# Patient Record
Sex: Male | Born: 1960
Health system: Southern US, Community
[De-identification: ages and names within clinical notes are randomized; demographics above are authoritative.]

## PROBLEM LIST (undated history)

## (undated) DIAGNOSIS — G4730 Sleep apnea, unspecified: Secondary | ICD-10-CM

## (undated) DIAGNOSIS — I10 Essential (primary) hypertension: Secondary | ICD-10-CM

## (undated) DIAGNOSIS — K219 Gastro-esophageal reflux disease without esophagitis: Secondary | ICD-10-CM

## (undated) DIAGNOSIS — E785 Hyperlipidemia, unspecified: Secondary | ICD-10-CM

## (undated) DIAGNOSIS — F419 Anxiety disorder, unspecified: Secondary | ICD-10-CM

## (undated) DIAGNOSIS — J189 Pneumonia, unspecified organism: Secondary | ICD-10-CM

## (undated) DIAGNOSIS — M519 Unspecified thoracic, thoracolumbar and lumbosacral intervertebral disc disorder: Secondary | ICD-10-CM

## (undated) DIAGNOSIS — R7303 Prediabetes: Secondary | ICD-10-CM

## (undated) DIAGNOSIS — K529 Noninfective gastroenteritis and colitis, unspecified: Secondary | ICD-10-CM

## (undated) DIAGNOSIS — K635 Polyp of colon: Secondary | ICD-10-CM

## (undated) DIAGNOSIS — M4310 Spondylolisthesis, site unspecified: Secondary | ICD-10-CM

## (undated) HISTORY — PX: COLONOSCOPY: SHX174

## (undated) HISTORY — DX: Sleep apnea, unspecified: G47.30

## (undated) HISTORY — DX: Noninfective gastroenteritis and colitis, unspecified: K52.9

## (undated) HISTORY — DX: Gastro-esophageal reflux disease without esophagitis: K21.9

## (undated) HISTORY — DX: Essential (primary) hypertension: I10

## (undated) HISTORY — PX: SPINAL FUSION: SHX223

## (undated) HISTORY — DX: Pneumonia, unspecified organism: J18.9

## (undated) HISTORY — DX: Anxiety disorder, unspecified: F41.9

## (undated) HISTORY — DX: Prediabetes: R73.03

## (undated) HISTORY — DX: Polyp of colon: K63.5

## (undated) HISTORY — DX: Unspecified thoracic, thoracolumbar and lumbosacral intervertebral disc disorder: M51.9

## (undated) HISTORY — DX: Hyperlipidemia, unspecified: E78.5

## (undated) HISTORY — DX: Spondylolisthesis, site unspecified: M43.10

---

## 1993-01-25 DIAGNOSIS — J189 Pneumonia, unspecified organism: Secondary | ICD-10-CM

## 1993-01-25 HISTORY — DX: Pneumonia, unspecified organism: J18.9

## 1995-01-26 HISTORY — PX: LAMINECTOMY: SHX219

## 1996-12-10 ENCOUNTER — Encounter: Payer: Self-pay | Admitting: Internal Medicine

## 1997-07-04 ENCOUNTER — Emergency Department (HOSPITAL_COMMUNITY): Admission: EM | Admit: 1997-07-04 | Discharge: 1997-07-04 | Payer: Self-pay | Admitting: Endocrinology

## 1997-11-17 ENCOUNTER — Encounter: Payer: Self-pay | Admitting: Neurosurgery

## 1997-11-17 ENCOUNTER — Ambulatory Visit (HOSPITAL_COMMUNITY): Admission: RE | Admit: 1997-11-17 | Discharge: 1997-11-17 | Payer: Self-pay | Admitting: Neurosurgery

## 1997-12-04 ENCOUNTER — Encounter: Payer: Self-pay | Admitting: Neurosurgery

## 1997-12-04 ENCOUNTER — Ambulatory Visit (HOSPITAL_COMMUNITY): Admission: RE | Admit: 1997-12-04 | Discharge: 1997-12-04 | Payer: Self-pay | Admitting: Neurosurgery

## 1997-12-20 ENCOUNTER — Encounter: Payer: Self-pay | Admitting: Neurosurgery

## 1998-01-03 ENCOUNTER — Ambulatory Visit (HOSPITAL_COMMUNITY): Admission: RE | Admit: 1998-01-03 | Discharge: 1998-01-03 | Payer: Self-pay | Admitting: Neurosurgery

## 1998-01-03 ENCOUNTER — Encounter: Payer: Self-pay | Admitting: Neurosurgery

## 2000-06-25 DIAGNOSIS — K529 Noninfective gastroenteritis and colitis, unspecified: Secondary | ICD-10-CM

## 2000-06-25 HISTORY — DX: Noninfective gastroenteritis and colitis, unspecified: K52.9

## 2003-10-04 ENCOUNTER — Encounter: Payer: Self-pay | Admitting: Internal Medicine

## 2004-01-17 ENCOUNTER — Ambulatory Visit: Payer: Self-pay | Admitting: Family Medicine

## 2004-01-19 ENCOUNTER — Emergency Department: Payer: Self-pay | Admitting: Unknown Physician Specialty

## 2004-01-19 ENCOUNTER — Other Ambulatory Visit: Payer: Self-pay

## 2004-12-02 ENCOUNTER — Ambulatory Visit: Payer: Self-pay | Admitting: Internal Medicine

## 2005-01-25 HISTORY — PX: LUMBAR SPINE SURGERY: SHX701

## 2005-04-02 ENCOUNTER — Ambulatory Visit: Payer: Self-pay | Admitting: Internal Medicine

## 2005-07-27 ENCOUNTER — Ambulatory Visit: Payer: Self-pay | Admitting: Family Medicine

## 2005-07-29 ENCOUNTER — Encounter: Admission: RE | Admit: 2005-07-29 | Discharge: 2005-07-29 | Payer: Self-pay | Admitting: Family Medicine

## 2005-08-02 ENCOUNTER — Encounter: Admission: RE | Admit: 2005-08-02 | Discharge: 2005-08-02 | Payer: Self-pay | Admitting: Family Medicine

## 2005-08-02 ENCOUNTER — Encounter (INDEPENDENT_AMBULATORY_CARE_PROVIDER_SITE_OTHER): Payer: Self-pay | Admitting: Internal Medicine

## 2005-12-02 ENCOUNTER — Ambulatory Visit: Payer: Self-pay | Admitting: Internal Medicine

## 2006-02-11 ENCOUNTER — Ambulatory Visit: Payer: Self-pay | Admitting: Internal Medicine

## 2006-09-19 ENCOUNTER — Ambulatory Visit: Payer: Self-pay | Admitting: Family Medicine

## 2006-09-28 ENCOUNTER — Encounter: Payer: Self-pay | Admitting: Family Medicine

## 2006-09-28 ENCOUNTER — Ambulatory Visit: Payer: Self-pay | Admitting: *Deleted

## 2006-10-05 ENCOUNTER — Ambulatory Visit: Payer: Self-pay | Admitting: *Deleted

## 2006-10-05 DIAGNOSIS — K219 Gastro-esophageal reflux disease without esophagitis: Secondary | ICD-10-CM | POA: Insufficient documentation

## 2006-10-05 DIAGNOSIS — E785 Hyperlipidemia, unspecified: Secondary | ICD-10-CM | POA: Insufficient documentation

## 2006-10-05 DIAGNOSIS — M5137 Other intervertebral disc degeneration, lumbosacral region: Secondary | ICD-10-CM | POA: Insufficient documentation

## 2006-10-05 DIAGNOSIS — J4530 Mild persistent asthma, uncomplicated: Secondary | ICD-10-CM | POA: Insufficient documentation

## 2006-10-12 ENCOUNTER — Ambulatory Visit: Payer: Self-pay | Admitting: *Deleted

## 2006-10-19 ENCOUNTER — Ambulatory Visit: Payer: Self-pay | Admitting: Internal Medicine

## 2006-10-19 DIAGNOSIS — F411 Generalized anxiety disorder: Secondary | ICD-10-CM | POA: Insufficient documentation

## 2006-10-20 LAB — CONVERTED CEMR LAB
ALT: 22 units/L (ref 0–53)
AST: 20 units/L (ref 0–37)
Albumin: 4.1 g/dL (ref 3.5–5.2)
Alkaline Phosphatase: 44 units/L (ref 39–117)
BUN: 11 mg/dL (ref 6–23)
Basophils Absolute: 0 10*3/uL (ref 0.0–0.1)
Basophils Relative: 0.2 % (ref 0.0–1.0)
Bilirubin, Direct: 0.1 mg/dL (ref 0.0–0.3)
CO2: 30 meq/L (ref 19–32)
Calcium: 9.3 mg/dL (ref 8.4–10.5)
Chloride: 107 meq/L (ref 96–112)
Cholesterol: 224 mg/dL (ref 0–200)
Creatinine, Ser: 1 mg/dL (ref 0.4–1.5)
Direct LDL: 154 mg/dL
Eosinophils Absolute: 0.1 10*3/uL (ref 0.0–0.6)
Eosinophils Relative: 1.4 % (ref 0.0–5.0)
GFR calc Af Amer: 104 mL/min
GFR calc non Af Amer: 86 mL/min
Glucose, Bld: 93 mg/dL (ref 70–99)
HCT: 41.3 % (ref 39.0–52.0)
HDL: 36.8 mg/dL — ABNORMAL LOW (ref >39.0)
Hemoglobin: 14 g/dL (ref 13.0–17.0)
Lymphocytes Relative: 26.6 % (ref 12.0–46.0)
MCHC: 33.8 g/dL (ref 30.0–36.0)
MCV: 84.5 fL (ref 78.0–100.0)
Monocytes Absolute: 0.4 10*3/uL (ref 0.2–0.7)
Monocytes Relative: 7.3 % (ref 3.0–11.0)
Neutro Abs: 3.9 10*3/uL (ref 1.4–7.7)
Neutrophils Relative %: 64.5 % (ref 43.0–77.0)
Phosphorus: 3.8 mg/dL (ref 2.3–4.6)
Platelets: 228 10*3/uL (ref 150–400)
Potassium: 4.3 meq/L (ref 3.5–5.1)
RBC: 4.88 M/uL (ref 4.22–5.81)
RDW: 13.2 % (ref 11.5–14.6)
Sodium: 143 meq/L (ref 135–145)
TSH: 1.17 microintl units/mL (ref 0.35–5.50)
Total Bilirubin: 0.8 mg/dL (ref 0.3–1.2)
Total CHOL/HDL Ratio: 6.1
Total Protein: 7.1 g/dL (ref 6.0–8.3)
Triglycerides: 203 mg/dL (ref 0–149)
VLDL: 41 mg/dL — ABNORMAL HIGH (ref 0–40)
WBC: 6 10*3/uL (ref 4.5–10.5)

## 2006-11-02 ENCOUNTER — Ambulatory Visit: Payer: Self-pay | Admitting: *Deleted

## 2006-11-14 ENCOUNTER — Telehealth (INDEPENDENT_AMBULATORY_CARE_PROVIDER_SITE_OTHER): Payer: Self-pay | Admitting: *Deleted

## 2006-11-29 ENCOUNTER — Telehealth (INDEPENDENT_AMBULATORY_CARE_PROVIDER_SITE_OTHER): Payer: Self-pay | Admitting: *Deleted

## 2006-12-27 ENCOUNTER — Other Ambulatory Visit: Payer: Self-pay

## 2006-12-27 ENCOUNTER — Emergency Department: Payer: Self-pay | Admitting: Emergency Medicine

## 2006-12-28 ENCOUNTER — Ambulatory Visit: Payer: Self-pay | Admitting: Internal Medicine

## 2006-12-29 LAB — CONVERTED CEMR LAB
ALT: 24 units/L (ref 0–53)
AST: 21 units/L (ref 0–37)
Albumin: 4.3 g/dL (ref 3.5–5.2)
Alkaline Phosphatase: 40 units/L (ref 39–117)
BUN: 15 mg/dL (ref 6–23)
Basophils Absolute: 0 10*3/uL (ref 0.0–0.1)
Basophils Relative: 0.1 % (ref 0.0–1.0)
Bilirubin, Direct: 0.1 mg/dL (ref 0.0–0.3)
CO2: 29 meq/L (ref 19–32)
Calcium: 10.2 mg/dL (ref 8.4–10.5)
Chloride: 100 meq/L (ref 96–112)
Creatinine, Ser: 0.9 mg/dL (ref 0.4–1.5)
Eosinophils Absolute: 0.1 10*3/uL (ref 0.0–0.6)
Eosinophils Relative: 0.7 % (ref 0.0–5.0)
GFR calc Af Amer: 118 mL/min
GFR calc non Af Amer: 97 mL/min
Glucose, Bld: 96 mg/dL (ref 70–99)
HCT: 40.3 % (ref 39.0–52.0)
Hemoglobin: 14.2 g/dL (ref 13.0–17.0)
Lymphocytes Relative: 16.7 % (ref 12.0–46.0)
MCHC: 35.1 g/dL (ref 30.0–36.0)
MCV: 83.9 fL (ref 78.0–100.0)
Monocytes Absolute: 0.5 10*3/uL (ref 0.2–0.7)
Monocytes Relative: 6 % (ref 3.0–11.0)
Neutro Abs: 6.4 10*3/uL (ref 1.4–7.7)
Neutrophils Relative %: 76.5 % (ref 43.0–77.0)
Phosphorus: 4.1 mg/dL (ref 2.3–4.6)
Platelets: 229 10*3/uL (ref 150–400)
Potassium: 4.3 meq/L (ref 3.5–5.1)
RBC: 4.8 M/uL (ref 4.22–5.81)
RDW: 12.9 % (ref 11.5–14.6)
Sed Rate: 15 mm/hr (ref 0–20)
Sodium: 138 meq/L (ref 135–145)
TSH: 0.8 microintl units/mL (ref 0.35–5.50)
Total Bilirubin: 0.9 mg/dL (ref 0.3–1.2)
Total Protein: 7.3 g/dL (ref 6.0–8.3)
WBC: 8.4 10*3/uL (ref 4.5–10.5)

## 2007-01-11 ENCOUNTER — Encounter: Payer: Self-pay | Admitting: Internal Medicine

## 2007-01-13 ENCOUNTER — Telehealth: Payer: Self-pay | Admitting: Family Medicine

## 2007-01-13 ENCOUNTER — Ambulatory Visit: Payer: Self-pay | Admitting: Internal Medicine

## 2007-01-16 ENCOUNTER — Encounter: Payer: Self-pay | Admitting: Internal Medicine

## 2007-01-16 ENCOUNTER — Telehealth (INDEPENDENT_AMBULATORY_CARE_PROVIDER_SITE_OTHER): Payer: Self-pay | Admitting: *Deleted

## 2007-01-16 ENCOUNTER — Ambulatory Visit: Payer: Self-pay | Admitting: Internal Medicine

## 2007-01-17 ENCOUNTER — Telehealth (INDEPENDENT_AMBULATORY_CARE_PROVIDER_SITE_OTHER): Payer: Self-pay | Admitting: *Deleted

## 2007-02-17 ENCOUNTER — Telehealth (INDEPENDENT_AMBULATORY_CARE_PROVIDER_SITE_OTHER): Payer: Self-pay | Admitting: *Deleted

## 2007-02-22 ENCOUNTER — Encounter: Payer: Self-pay | Admitting: Internal Medicine

## 2007-03-06 ENCOUNTER — Ambulatory Visit: Payer: Self-pay | Admitting: Unknown Physician Specialty

## 2007-05-22 ENCOUNTER — Telehealth (INDEPENDENT_AMBULATORY_CARE_PROVIDER_SITE_OTHER): Payer: Self-pay | Admitting: *Deleted

## 2007-11-06 ENCOUNTER — Telehealth: Payer: Self-pay | Admitting: Family Medicine

## 2007-11-08 ENCOUNTER — Telehealth: Payer: Self-pay | Admitting: Internal Medicine

## 2007-11-15 ENCOUNTER — Telehealth: Payer: Self-pay | Admitting: Internal Medicine

## 2007-12-01 ENCOUNTER — Ambulatory Visit: Payer: Self-pay | Admitting: Internal Medicine

## 2007-12-05 ENCOUNTER — Telehealth: Payer: Self-pay | Admitting: Internal Medicine

## 2008-03-07 ENCOUNTER — Encounter (INDEPENDENT_AMBULATORY_CARE_PROVIDER_SITE_OTHER): Payer: Self-pay | Admitting: *Deleted

## 2008-03-11 ENCOUNTER — Telehealth: Payer: Self-pay | Admitting: Internal Medicine

## 2008-05-15 ENCOUNTER — Telehealth: Payer: Self-pay | Admitting: Internal Medicine

## 2008-06-25 ENCOUNTER — Telehealth: Payer: Self-pay | Admitting: Internal Medicine

## 2008-08-12 ENCOUNTER — Telehealth: Payer: Self-pay | Admitting: Internal Medicine

## 2008-09-05 ENCOUNTER — Telehealth: Payer: Self-pay | Admitting: Internal Medicine

## 2008-09-12 ENCOUNTER — Telehealth: Payer: Self-pay | Admitting: Internal Medicine

## 2008-10-14 ENCOUNTER — Telehealth: Payer: Self-pay | Admitting: Internal Medicine

## 2008-12-10 ENCOUNTER — Telehealth: Payer: Self-pay | Admitting: Internal Medicine

## 2008-12-31 ENCOUNTER — Telehealth: Payer: Self-pay | Admitting: Internal Medicine

## 2009-01-10 ENCOUNTER — Telehealth: Payer: Self-pay | Admitting: Internal Medicine

## 2009-01-20 ENCOUNTER — Ambulatory Visit: Payer: Self-pay | Admitting: Internal Medicine

## 2009-01-20 ENCOUNTER — Telehealth: Payer: Self-pay | Admitting: Internal Medicine

## 2009-02-07 ENCOUNTER — Telehealth: Payer: Self-pay | Admitting: Internal Medicine

## 2009-02-07 ENCOUNTER — Ambulatory Visit: Payer: Self-pay | Admitting: Internal Medicine

## 2009-02-08 LAB — CONVERTED CEMR LAB
Albumin: 4.4 g/dL (ref 3.5–5.2)
Calcium: 8.8 mg/dL (ref 8.4–10.5)
Eosinophils Relative: 1.6 % (ref 0.0–5.0)
HCT: 42.3 % (ref 39.0–52.0)
Hemoglobin: 13.8 g/dL (ref 13.0–17.0)
Lymphs Abs: 1.5 10*3/uL (ref 0.7–4.0)
Monocytes Relative: 8.8 % (ref 3.0–12.0)
Phosphorus: 3.5 mg/dL (ref 2.3–4.6)
Platelets: 248 10*3/uL (ref 150.0–400.0)
RBC: 4.78 M/uL (ref 4.22–5.81)
Sodium: 142 meq/L (ref 135–145)
TSH: 1.05 microintl units/mL (ref 0.35–5.50)
WBC: 6.3 10*3/uL (ref 4.5–10.5)

## 2009-02-10 LAB — CONVERTED CEMR LAB
ALT: 23 units/L (ref 0–53)
AST: 19 units/L (ref 0–37)
Albumin: 4.3 g/dL (ref 3.5–5.2)
HDL: 47 mg/dL (ref >39)
Total Bilirubin: 0.6 mg/dL (ref 0.3–1.2)
Total CHOL/HDL Ratio: 4.7

## 2009-02-20 ENCOUNTER — Telehealth: Payer: Self-pay | Admitting: Internal Medicine

## 2009-03-17 ENCOUNTER — Ambulatory Visit: Payer: Self-pay | Admitting: Family Medicine

## 2009-05-08 ENCOUNTER — Telehealth: Payer: Self-pay | Admitting: Internal Medicine

## 2009-06-12 ENCOUNTER — Telehealth: Payer: Self-pay | Admitting: Internal Medicine

## 2009-07-14 ENCOUNTER — Telehealth: Payer: Self-pay | Admitting: Internal Medicine

## 2009-08-13 ENCOUNTER — Telehealth: Payer: Self-pay | Admitting: Internal Medicine

## 2009-09-15 ENCOUNTER — Telehealth: Payer: Self-pay | Admitting: Internal Medicine

## 2009-11-24 ENCOUNTER — Telehealth: Payer: Self-pay | Admitting: Internal Medicine

## 2010-02-03 ENCOUNTER — Telehealth: Payer: Self-pay | Admitting: Internal Medicine

## 2010-02-24 NOTE — Assessment & Plan Note (Signed)
Summary: cpx/cmt   Vital Signs:  Patient profile:   50 year old male Weight:      248 pounds Temp:     97.2 degrees F oral Pulse rate:   76 / minute Pulse rhythm:   regular BP sitting:   120 / 88  (left arm) Cuff size:   large  Vitals Entered By: Mervin Hack CMA Duncan Dull) (February 07, 2009 8:31 AM) CC: adult physical   History of Present Illness: Feels better now Hasn't needed the nebs lately  Still with fluctuating back pain Still with numbness/burning  in right foot No weakness now though uses tramadol 1.5-3 daily. Occ doesn't need any!   Allergies: 1)  ! Cipro (Ciprofloxacin Hcl) 2)  * Iv Dyes  Past History:  Past medical, surgical, family and social histories (including risk factors) reviewed for relevance to current acute and chronic problems.  Past Medical History: Reviewed history from 10/19/2006 and no changes required. Asthma GERD Hyperlipidemia Lumbar disk disease Anxiety with panic  Past Surgical History: Pneumonia 1995 Disc/Laminectomy 1997 Gastroeenteritis  06/02 Lumbar spine surgery x 2  --fusion, etc  2007  Family History: Reviewed history from 12/01/2007 and no changes required. Dad  with depression  & nervous breakdown. Also HTN and ulcer Son with bipolar disorder--now schizophrenia Sister with nervous breakdown also Allergies in family No CAD or DM Pat GM died @63  of ovarian cancer Mat GF died of lung cancer No migraines  Social History: Reviewed history from 10/19/2006 and no changes required. 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 Eyes:  Denies double vision and vision loss-1 eye. ENT:  Denies decreased hearing and ringing in ears; Occ noise in ears--"I hear crickets" teeth okay---regular with dentist. CV:  Denies  chest pain or discomfort, difficulty breathing at night, difficulty breathing while lying down, fainting, lightheadness, palpitations, and shortness of breath with exertion. Resp:  Complains of cough; denies shortness of breath; only occ cough. GI:  Complains of indigestion; denies abdominal pain, bloody stools, change in bowel habits, dark tarry stools, nausea, and vomiting; occ night reflux--uses OTC med as needed with good result. GU:  Denies erectile dysfunction, urinary frequency, and urinary hesitancy; stream is slower. MS:  Complains of low back pain; denies joint pain and joint swelling. Derm:  Complains of lesion(s); denies rash; 1 suspicious area on back wife is concerned about. Neuro:  See HPI; Complains of headaches, numbness, and tingling; denies weakness; does get tension headaches at work. Psych:  Denies anxiety and depression; Panic is controlled with sertraline. Heme:  Denies abnormal bruising and enlarge lymph nodes. Allergy:  Denies seasonal allergies and sneezing.  Physical Exam  General:  alert and normal appearance.   Eyes:  pupils equal, pupils round, pupils reactive to light, and no optic disk abnormalities.   Ears:  R ear normal and L ear normal.   Mouth:  no erythema, no exudates, and no lesions.   Neck:  supple, no masses, no thyromegaly, no carotid bruits, and no cervical lymphadenopathy.   Lungs:  normal respiratory effort and normal breath sounds.   Heart:  normal rate, regular rhythm, no murmur, and no gallop.   Abdomen:  soft and non-tender.   Msk:  no joint tenderness and no joint swelling.   Pulses:  1+ in feet Extremities:  no edema Neurologic:  alert & oriented X3, strength normal in all extremities, and gait normal.   Skin:  no rashes and no suspicious lesions.   multiple benign nevi Axillary Nodes:  No palpable lymphadenopathy Psych:  normally interactive, good eye contact, not anxious appearing, and not depressed appearing.     Impression &  Recommendations:  Problem # 1:  PREVENTIVE HEALTH CARE (ICD-V70.0) Assessment Comment Only doing well will check labs  Problem # 2:  DISC DISEASE, LUMBAR (ICD-722.52) Assessment: Unchanged doing okay with as needed tramadol  Problem # 3:  ANXIETY (ICD-300.00) Assessment: Unchanged doing well on current Rx  The following medications were removed from the medication list:    Alprazolam 0.25 Mg Tabs (Alprazolam) .Marland Kitchen... 1 two times a day as needed for nerves His updated medication list for this problem includes:    Sertraline Hcl 50 Mg Tabs (Sertraline hcl) .Marland Kitchen... Take 1 tablet by mouth once a day  Problem # 4:  ASTHMA (ICD-493.90) Assessment: Unchanged  generally mild and intermittent  The following medications were removed from the medication list:    Prednisone 20 Mg Tabs (Prednisone) .Marland Kitchen... 2 tabs daily for 5 daily then 1 tab daily for asthma His updated medication list for this problem includes:    Maxair Autohaler 200 Mcg/inh Aerb (Pirbuterol acetate) .Marland Kitchen... 2 puffs up to three times a day if needed    Pulmicort Flexhaler 180 Mcg/act Inha (Budesonide (inhalation)) .Marland Kitchen..Marland Kitchen Two puffs  a day    Albuterol Sulfate (2.5 Mg/29ml) 0.083% Nebu (Albuterol sulfate) .Marland Kitchen... 1 vial via nebulizer up to four times a day for asthma flare  Orders: TLB-CBC Platelet - w/Differential (85025-CBCD) TLB-Renal Function Panel (80069-RENAL) TLB-TSH (Thyroid Stimulating Hormone) (84443-TSH) Venipuncture (02542)  Problem # 5:  HYPERLIPIDEMIA (ICD-272.4) Assessment: Unchanged  no Rx for now will recheck  Labs Reviewed: SGOT: 21 (12/28/2006)   SGPT: 24 (12/28/2006)   HDL:36.8 (10/19/2006)  LDL:DEL (10/19/2006)  Chol:224 (10/19/2006)  Trig:203 (10/19/2006)  Orders: TLB-Lipid Panel (80061-LIPID) TLB-Hepatic/Liver Function Pnl (80076-HEPATIC)  Complete Medication List: 1)  Maxair Autohaler 200 Mcg/inh Aerb (Pirbuterol acetate) .... 2 puffs up to three times a day if needed 2)  Pulmicort Flexhaler 180  Mcg/act Inha (Budesonide (inhalation)) .... Two puffs  a day 3)  Sertraline Hcl 50 Mg Tabs (Sertraline hcl) .... Take 1 tablet by mouth once a day 4)  Tramadol Hcl 50 Mg Tabs (Tramadol hcl) .Marland Kitchen.. 1 tab three times a day as needed for back pain 5)  [facility] Ibuprofen 200 Mg Tabs (Ibuprofen) .... As needed pain 6)  Albuterol Sulfate (2.5 Mg/44ml) 0.083% Nebu (Albuterol sulfate) .Marland Kitchen.. 1 vial via nebulizer up to four times a day for asthma flare  Patient Instructions: 1)  Please schedule a follow-up appointment in 1 year.   Current Allergies (reviewed today): ! CIPRO (CIPROFLOXACIN HCL) * IV DYES  Appended Document: Orders Update    Clinical Lists Changes  Orders: Added new Test order of T- * Misc. Laboratory test [phone]) - Signed

## 2010-02-24 NOTE — Progress Notes (Signed)
Summary: TRAMADOL  Phone Note Refill Request Message [facility]:  [id number] on Jun 12, 2009 10:13 AM  Refills Requested: Medication #1:  TRAMADOL HCL 50 MG TABS 1 tab three times a day as needed for back pain   Last Refilled: 05/08/2009 E-Scribe Request    Method Requested: Electronic Initial call taken by: Mervin Hack CMA Duncan Dull),  Jun 12, 2009 10:13 AM  Follow-up for Phone Call        okay #90 x 0 Follow-up by: Cindee Salt MD,  Jun 12, 2009 1:16 PM  Additional Follow-up for Phone Call Additional follow up Details #1::        Rx faxed to pharmacy Additional Follow-up by: DeShannon Smith CMA Duncan Dull),  Jun 12, 2009 3:47 PM    Prescriptions: TRAMADOL HCL 50 MG TABS (TRAMADOL HCL) 1 tab three times a day as needed for back pain  #90 Tablet x 0   Entered by:   Mervin Hack CMA (AAMA)   Authorized by:   Cindee Salt MD   Signed by:   Mervin Hack CMA (AAMA) on 06/12/2009   Method used:   [facility] to        [facility] [phone]* (retail)       [address]       [place], [place]  [address]       Ph: [phone] or [phone]       Fax: [phone]   RxID:   [id number]

## 2010-02-24 NOTE — Progress Notes (Signed)
Summary: TRAMADOL  Phone Note Refill Request Message from:  [id number] on September 15, 2009 9:09 AM  Refills Requested: Medication #1:  TRAMADOL HCL 50 MG TABS 1 tab three times a day as needed for back pain   Last Refilled: 08/13/2009 E-Scribe Request ok to refill?   Method Requested: Electronic Initial call taken by: Mervin Hack CMA Duncan Dull),  September 15, 2009 9:09 AM  Follow-up for Phone Call        okay #90 x 0 Follow-up by: Cindee Salt MD,  September 15, 2009 1:37 PM  Additional Follow-up for Phone Call Additional follow up Details #1::        Rx faxed to pharmacy Additional Follow-up by: DeShannon Smith CMA Duncan Dull),  September 15, 2009 2:37 PM    Prescriptions: TRAMADOL HCL 50 MG TABS (TRAMADOL HCL) 1 tab three times a day as needed for back pain  #90 Tablet x 0   Entered by:   Mervin Hack CMA (AAMA)   Authorized by:   Cindee Salt MD   Signed by:   Mervin Hack CMA (AAMA) on 09/15/2009   Method used:   Electronically to        [facility] [phone]* (retail)       [address]       [place], [place]  [address]       Ph: [phone] or [phone]       Fax: [phone]   RxID:   [id number]

## 2010-02-24 NOTE — Progress Notes (Signed)
Summary: TRAMADOL   Phone Note Refill Request Message from:  [phone] on November 24, 2009 12:29 PM  Refills Requested: Medication #1:  TRAMADOL HCL 50 MG TABS 1 tab three times a day as needed for back pain   Last Refilled: 10/22/2009 E-Scribe Request    Method Requested: Electronic Initial call taken by: Mervin Hack CMA Duncan Dull),  November 24, 2009 12:29 PM  Follow-up for Phone Call        okay #90 x 1  Please have him set up PE for February Follow-up by: Cindee Salt MD,  November 24, 2009 2:01 PM  Additional Follow-up for Phone Call Additional follow up Details #1::        Rx faxed to pharmacy, pt's home number and work number are incorrect, will send note to pharmacy. Additional Follow-up by: Mervin Hack CMA Duncan Dull),  November 24, 2009 2:10 PM    Prescriptions: TRAMADOL HCL 50 MG TABS (TRAMADOL HCL) 1 tab three times a day as needed for back pain  #90 Tablet x 1   Entered by:   Mervin Hack CMA (AAMA)   Authorized by:   Cindee Salt MD   Signed by:   Mervin Hack CMA (AAMA) on 11/24/2009   Method used:   [facility]ronically to        [facility] [phone]* (retail)       [address]       [place], [place]  [address]       Ph: [phone] or [phone]       Fax: [phone]   RxID:   [id number]

## 2010-02-24 NOTE — Progress Notes (Signed)
Summary: Need tdap  Phone Note Outgoing Call Call back at [facility] Phone [phone] Call back at Work Phone [phone]   Call placed by: Mervin Hack CMA Duncan Dull),  February 07, 2009 12:58 PM Call placed to: Patient Summary of Call: calling pt to ask him to come back in for his Tdap, we forgot to give it to him during his office visit today. Initial call taken by: Mervin Hack CMA Duncan Dull),  February 07, 2009 12:59 PM  Follow-up for Phone Call        left message on machine for patient to return call. and left message at work for pt to return call. DeShannon Smith CMA Duncan Dull)  February 07, 2009 12:59 PM   patient states that 3 years ago when he had his last back surgery he received a tdap. I added historical to his chart. DeShannon Katrinka Blazing CMA Duncan Dull)  February 10, 2009 8:36 AM   Additional Follow-up for Phone Call Additional follow up Details #1::        okay Additional Follow-up by: Cindee Salt MD,  February 10, 2009 10:19 AM      Tetanus/Td Immunization History:    Tetanus/Td # 1:  Historical (01/25/2006)

## 2010-02-24 NOTE — Progress Notes (Signed)
Summary:  TRAMADOL  Phone Note Refill Request Message from:  [id number] on July 14, 2009 10:09 AM  Refills Requested: Medication #1:  TRAMADOL HCL 50 MG TABS 1 tab three times a day as needed for back pain   Last Refilled: 06/12/2009 E-Scribe Request    Method Requested: Electronic Initial call taken by: Mervin Hack CMA Duncan Dull),  July 14, 2009 10:09 AM  Follow-up for Phone Call        okay #90 x 0 Follow-up by: Cindee Salt MD,  July 14, 2009 1:12 PM  Additional Follow-up for Phone Call Additional follow up Details #1::        Rx faxed to pharmacy Additional Follow-up by: DeShannon Smith CMA Duncan Dull),  July 14, 2009 3:14 PM    Prescriptions: TRAMADOL HCL 50 MG TABS (TRAMADOL HCL) 1 tab three times a day as needed for back pain  #90 Tablet x 0   Entered by:   Mervin Hack CMA (AAMA)   Authorized by:   Cindee Salt MD   Signed by:   Mervin Hack CMA (AAMA) on 07/14/2009   Method used:   Electronically to        [facility] [phone]* (retail)       [address]       [place], [place]  [address]       Ph: [phone] or [phone]       Fax: [phone]   RxID:   [id number]

## 2010-02-24 NOTE — Progress Notes (Signed)
Summary: TRAMADOL  Phone Note Refill Request Message from:  [id number] on February 20, 2009 10:49 AM  Refills Requested: Medication #1:  TRAMADOL HCL 50 MG TABS 1 tab three times a day as needed for back pain no last refill date sent   Method Requested: Electronic Initial call taken by: Mervin Hack CMA Duncan Dull),  February 20, 2009 10:49 AM  Follow-up for Phone Call        okay #90 x 0 Follow-up by: Cindee Salt MD,  February 20, 2009 2:02 PM  Additional Follow-up for Phone Call Additional follow up Details #1::        Rx faxed to pharmacy Additional Follow-up by: DeShannon Smith CMA Duncan Dull),  February 20, 2009 2:34 PM    Prescriptions: TRAMADOL HCL 50 MG TABS (TRAMADOL HCL) 1 tab three times a day as needed for back pain  #90 x 0   Entered by:   Mervin Hack CMA (AAMA)   Authorized by:   Cindee Salt MD   Signed by:   Mervin Hack CMA (AAMA) on 02/20/2009   Method used:   Electronically to        [facility] [phone]* (retail)       [address]       [place], [place]  [address]       Ph: [phone] or [phone]       Fax: [phone]   RxID:   [id number]

## 2010-02-24 NOTE — Progress Notes (Signed)
Summary: Rx Tramadol  Phone Note Refill Request Call back at [phone] Message from:  [facility] on August 13, 2009 5:15 PM  Refills Requested: Medication #1:  TRAMADOL HCL 50 MG TABS 1 tab three times a day as needed for back pain No last refill date sent   Method Requested: Electronic Initial call taken by: Sydell Axon LPN,  August 13, 2009 5:15 PM  Follow-up for Phone Call        okay #90 x 0  [facility]. Janee Morn CMA  August 15, 2009 8:50 AM  Follow-up by: Cindee Salt MD,  August 15, 2009 7:52 AM

## 2010-02-24 NOTE — Progress Notes (Signed)
Summary: refill request for alprazolam  Phone Note Refill Request Message from:  Fax from Pharmacy  Refills Requested: Medication #1:  alprazolam 0.25 mg [facility], this is no longer on pt's med list.  Initial call taken by: Lowella Petties CMA,  May 08, 2009 11:40 AM  Follow-up for Phone Call        Okay #60 x 0 He told us he stopped in january will discuss at next visit Follow-up by: Cindee Salt MD,  May 08, 2009 1:13 PM  Additional Follow-up for Phone Call Additional follow up Details #1::        Rx faxed to pharmacy Additional Follow-up by: DeShannon Katrinka Blazing CMA Duncan Dull),  May 08, 2009 2:12 PM    New/Updated Medications: ALPRAZOLAM 0.25 MG TABS (ALPRAZOLAM) take 1 by mouth two times a day as needed nerves Prescriptions: ALPRAZOLAM 0.25 MG TABS (ALPRAZOLAM) take 1 by mouth two times a day as needed nerves  #60 x 0   Entered by:   Mervin Hack CMA (AAMA)   Authorized by:   Cindee Salt MD   Signed by:   Mervin Hack CMA (AAMA) on 05/08/2009   Method used:   Handwritten   RxID:   [id number]

## 2010-02-24 NOTE — Progress Notes (Signed)
Summary: TRAMADOL  Phone Note Refill Request Message from:  [id number] on May 08, 2009 1:56 PM  Refills Requested: Medication #1:  TRAMADOL HCL 50 MG TABS 1 tab three times a day as needed for back pain   Last Refilled: 04/01/2009 E-Scribe Request    Method Requested: Electronic Initial call taken by: Mervin Hack CMA Duncan Dull),  May 08, 2009 1:56 PM  Follow-up for Phone Call        okay #90 x 0 Follow-up by: Cindee Salt MD,  May 08, 2009 2:19 PM  Additional Follow-up for Phone Call Additional follow up Details #1::        Rx faxed to pharmacy Additional Follow-up by: DeShannon Katrinka Blazing CMA Duncan Dull),  May 08, 2009 2:55 PM    Prescriptions: TRAMADOL HCL 50 MG TABS (TRAMADOL HCL) 1 tab three times a day as needed for back pain  #90 Tablet x 0   Entered by:   Mervin Hack CMA (AAMA)   Authorized by:   Cindee Salt MD   Signed by:   Mervin Hack CMA (AAMA) on 05/08/2009   Method used:   Electronically to        [facility] [phone]* (retail)       [address]       [place], [place]  [address]       Ph: [phone] or [phone]       Fax: [phone]   RxID:   [id number]

## 2010-02-24 NOTE — Assessment & Plan Note (Signed)
Summary: ASTHMA AND CHEST COLD/JRR   Vital Signs:  Patient profile:   49 year old male Height:      73 inches Weight:      253 pounds BMI:     33.50 O2 Sat:      98 % on Room air Temp:     98.2 degrees F oral Pulse rate:   88 / minute Pulse rhythm:   regular BP sitting:   122 / 80  (left arm) Cuff size:   large  Vitals Entered By: Delilah Shan CMA Duncan Dull) (March 17, 2009 10:28 AM)  O2 Flow:  Room air CC: Asthma and chest cold   History of Present Illness: 50 yo with h/o asthma here for asthma and chest cold. Has had symptoms for over a week. ran out of nebulizer packets, has had to use them 5 times a day for past week. Also has maxair but feels it doesnt work as well for acute episode. Supposed to take Pulmicort but usually forgets. Everyone at work has URI symptoms.  Feels lungs are tight. Very wheezy, cant sleep, productive cough.   Lungs are tight and wheezing  Has had subjective fevers and chills.      Allergies: 1)  ! Cipro (Ciprofloxacin Hcl) 2)  * Iv Dyes  Review of Systems      See HPI General:  Complains of chills, fever, and malaise; denies fatigue. ENT:  Denies nasal congestion, postnasal drainage, ringing in ears, sinus pressure, and sore throat. CV:  Denies chest pain or discomfort. Resp:  Complains of cough, shortness of breath, sputum productive, and wheezing.  Physical Exam  General:  alert and normal appearance.   vital signs reviewed- afebrile, normotensive,  O2 Sat 98%. Ears:  R ear normal and L ear normal.   Mouth:  no erythema, no exudates, and no lesions.   Lungs:  NO increased work of breathing.   Scattered exp wheezing with forced expiration. Heart:  normal rate, regular rhythm, no murmur, and no gallop.   Extremities:  no edema Psych:  normally interactive, good eye contact, not anxious appearing, and not depressed appearing.     Impression & Recommendations:  Problem # 1:  ASTHMA (ICD-493.90) Assessment  Deteriorated Likely an acute bronchitis. Pt feels that chest is very tight eventhough lungs sound quite good. Given Kenalog injection in office, along with neb treatment. Treat with Augmentin, short course of prednisone and refill nebs. Advised to f/u if no improvement by Weds.  His updated medication list for this problem includes:    Maxair Autohaler 200 Mcg/inh Aerb (Pirbuterol acetate) .Marland Kitchen... 2 puffs up to three times a day if needed    Pulmicort Flexhaler 180 Mcg/act Inha (Budesonide (inhalation)) .Marland Kitchen..Marland Kitchen Two puffs  a day    Albuterol Sulfate (2.5 Mg/47ml) 0.083% Nebu (Albuterol sulfate) .Marland Kitchen... 1 vial via nebulizer up to four times a day for asthma flare    Prednisone 10 Mg Tabs (Prednisone) .Marland Kitchen... 2 tabs by mouth daily x 5 days, then 1 tab by mouth daily x 5 days, then stop.  Orders: Nebulizer Tx (32355) Albuterol Sulfate Sol 1mg  unit dose (D3220)  Complete Medication List: 1)  Maxair Autohaler 200 Mcg/inh Aerb (Pirbuterol acetate) .... 2 puffs up to three times a day if needed 2)  Pulmicort Flexhaler 180 Mcg/act Inha (Budesonide (inhalation)) .... Two puffs  a day 3)  Sertraline Hcl 50 Mg Tabs (Sertraline hcl) .... Take 1 tablet by mouth once a day 4)  Tramadol Hcl 50 Mg Tabs (  Tramadol hcl) .Marland Kitchen.. 1 tab three times a day as needed for back pain 5)  [facility] Ibuprofen 200 Mg Tabs (Ibuprofen) .... As needed pain 6)  Albuterol Sulfate (2.5 Mg/82ml) 0.083% Nebu (Albuterol sulfate) .Marland Kitchen.. 1 vial via nebulizer up to four times a day for asthma flare 7)  Augmentin 875-125 Mg Tabs (Amoxicillin-pot clavulanate) .Marland Kitchen.. 1 by mouth 2 times daily x 10 days 8)  Prednisone 10 Mg Tabs (Prednisone) .... 2 tabs by mouth daily x 5 days, then 1 tab by mouth daily x 5 days, then stop. Prescriptions: PREDNISONE 10 MG TABS (PREDNISONE) 2 tabs by mouth daily x 5 days, then 1 tab by mouth daily x 5 days, then stop.  #15 x 0   Entered and Authorized by:   Ruthe Mannan MD   Signed by:   Ruthe Mannan MD on 03/17/2009   Method  used:   Electronically to        [facility] [address] (retail)       [address], [address]       [place], [place]  [address]       Ph: [phone]       Fax: [phone]   RxID:   [id number] AUGMENTIN 875-125 MG TABS (AMOXICILLIN-POT CLAVULANATE) 1 by mouth 2 times daily x 10 days  #20 x 0   Entered and Authorized by:   Ruthe Mannan MD   Signed by:   Ruthe Mannan MD on 03/17/2009   Method used:   Electronically to        [facility] [address] (retail)       [address]       [place], [place]  [address]       Ph: [phone]       Fax: [phone]   RxID:   [id number] ALBUTEROL SULFATE (2.5 MG/3ML) 0.083% NEBU (ALBUTEROL SULFATE) 1 vial via nebulizer up to four times a day for asthma flare  #60 x 0   Entered and Authorized by:   Ruthe Mannan MD   Signed by:   Ruthe Mannan MD on 03/17/2009   Method used:   Electronically to        [facility] [address] (retail)       [address], [address]       [place], [place]  [address]       Ph: [phone]       Fax: [phone]   RxID:   [id number]   Current Allergies (reviewed today): ! CIPRO (CIPROFLOXACIN HCL) * IV DYES   Medication Administration  Injection # 1:    Medication: Kenalog 10 mg inj    Diagnosis: ASTHMA (ICD-493.90)    Route: IM    Site: L deltoid    Exp Date: 04/25/2010    Lot #: [id number]    Mfr: [place]    Patient tolerated injection without complications    Given by: Delilah Shan CMA (AAMA) (March 17, 2009 10:54 AM)  Medication # 1:    Medication: Albuterol Sulfate Sol 1mg  unit dose    Diagnosis: ASTHMA (ICD-493.90)    Dose: 2.5 mg/mL    Route: inhaled    Exp Date: 03/25/2010    Lot #: [id number]    Mfr: Nephron    Comments: Given 2 ampules for adult dose.  Patient tolerated medication without complications    Given by: Delilah Shan CMA Duncan Dull) (March 17, 2009 10:56 AM)  Orders  Added: 1)  Nebulizer Tx [[address]] 2)  Albuterol Sulfate Sol 1mg  unit dose [J7613] 3)  Est. Patient Level III [[address]]

## 2010-02-25 HISTORY — PX: MENISCUS REPAIR: SHX5179

## 2010-02-25 HISTORY — PX: KNEE ARTHROSCOPY: SUR90

## 2010-02-26 NOTE — Progress Notes (Signed)
Summary: requests refills on tramadol, maxair  Phone Note Refill Request Call back at cell [phone] Message from:  Patient  Refills Requested: Medication #1:  TRAMADOL HCL 50 MG TABS 1 tab three times a day as needed for back pain  Medication #2:  MAXAIR AUTOHALER 200 MCG/INH AERB 2 puffs up to three times a day if needed Pt has made appt for physical on 5/24.  He is requesting refills until then.  [facility] [place]  Initial call taken by: Lowella Petties CMA, AAMA,  February 03, 2010 8:26 AM  Follow-up for Phone Call        okay #90 x 1 for tramadol  #1 x 1 for maxair Follow-up by: Cindee Salt MD,  February 03, 2010 9:07 AM  Additional Follow-up for Phone Call Additional follow up Details #1::        Rx faxed to pharmacy Additional Follow-up by: DeShannon Smith CMA Duncan Dull),  February 03, 2010 9:39 AM    Prescriptions: TRAMADOL HCL 50 MG TABS (TRAMADOL HCL) 1 tab three times a day as needed for back pain  #90 Tablet x 1   Entered by:   Mervin Hack CMA (AAMA)   Authorized by:   Cindee Salt MD   Signed by:   Mervin Hack CMA (AAMA) on 02/03/2010   Method used:   [facility]        [facility] [phone][facility] (retail)       [address]       [place], [place]  [address]       Ph: [phone] or [phone]       Fax: [phone]   RxID:   [id number] MAXAIR AUTOHALER 200 MCG/INH AERB (PIRBUTEROL ACETATE) 2 puffs up to three times a day if needed  #1 x 1   Entered by:   Mervin Hack CMA (AAMA)   Authorized by:   Cindee Salt MD   Signed by:   Mervin Hack CMA (AAMA) on 02/03/2010   Method used:   [facility]ronically to        [facility] [phone]* (retail)       [address]       [place], [place]  [address]       Ph: [phone] or [phone]       Fax: [phone]   RxID:   [id number]

## 2010-03-04 ENCOUNTER — Encounter: Payer: Self-pay | Admitting: Internal Medicine

## 2010-03-18 NOTE — Letter (Signed)
Summary: [facility]  [facility]   Imported By: Maryln Gottron 03/10/2010 15:33:16  _____________________________________________________________________  External Attachment:    Type:   Image     Comment:   External Document  Appended Document: [facility] post op check from left knee arthroscopy

## 2010-03-26 ENCOUNTER — Encounter: Payer: Self-pay | Admitting: Family Medicine

## 2010-03-26 ENCOUNTER — Ambulatory Visit (INDEPENDENT_AMBULATORY_CARE_PROVIDER_SITE_OTHER): Payer: 59 | Admitting: Family Medicine

## 2010-03-26 DIAGNOSIS — J209 Acute bronchitis, unspecified: Secondary | ICD-10-CM

## 2010-04-01 ENCOUNTER — Encounter: Payer: Self-pay | Admitting: Internal Medicine

## 2010-04-02 NOTE — Assessment & Plan Note (Signed)
Summary: ASTHMA/CLE   [facility]   Vital Signs:  Patient profile:   50 year old male Weight:      259.25 pounds BMI:     34.33 O2 Sat:      98 % on Room air Temp:     98.0 degrees F oral Pulse rate:   84 / minute Pulse rhythm:   regular BP sitting:   130 / 80  (left arm) Cuff size:   large  Vitals Entered By: Selena Batten Dance CMA (AAMA) (March 26, 2010 9:03 AM)  O2 Flow:  Room air  Serial Vital Signs/Assessments:                                PEF    PreRx  PostRx Time      O2 Sat  O2 Type     L/min  L/min  L/min   By 9:36 AM                       550                   Kim Dance CMA (AAMA)  CC: Asthma/chest congestion   History of Present Illness: CC: asthma/congestion?  h/o asthma.  1wk h/o bad cough.  + chest congestion, coughing up green mucous, + RN.  + nausea.  + waking up at night coughing.  slept sitting up in chair last night.  using albuterol nebulizer regularly to keep lungs open otherwise feels tightness.  No fever.  No ST, nasal congestion, abd pain, v/d, rashes, myalgias, arthralgias.  uses albuterol nebulizer and inhaler.  Also on pulmicort once daily.  has been hospitalized in ICU for asthma in past but years ago.  never intubated.  No smokers at home, wife with some congestion as well.  Current Medications (verified): 1)  Maxair Autohaler 200 Mcg/inh Aerb (Pirbuterol Acetate) .... 2 Puffs Up To Three Times A Day If Needed 2)  Pulmicort Flexhaler 180 Mcg/act  Inha (Budesonide (Inhalation)) .... Two Puffs  A Day 3)  Sertraline Hcl 50 Mg  Tabs (Sertraline Hcl) .... Take 1 Tablet By Mouth Once A Day [currently Not Taking 03/2010] 4)  Tramadol Hcl 50 Mg Tabs (Tramadol Hcl) .Marland Kitchen.. 1 Tab Three Times A Day As Needed For Back Pain 5)  [facility] Ibuprofen 200 Mg Tabs (Ibuprofen) .... As Needed Pain 6)  Albuterol Sulfate (2.5 Mg/4ml) 0.083% Nebu (Albuterol Sulfate) .Marland Kitchen.. 1 Vial Via Nebulizer Up To Four Times A Day For Asthma Flare 7)  Alprazolam 0.25 Mg Tabs (Alprazolam) .... Take 1 By  Mouth Two Times A Day As Needed Nerves [currently Not Taking 03/2010]  Allergies: 1)  ! 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  coughing some, VSS Head:  no maxillary or frontal tenderness Eyes:  pupils equal, pupils round, pupils reactive to light Ears:  R ear normal and L ear normal.   Nose:  nares clear Mouth:  no erythema, no exudates, and no lesions.  Neck:  supple, no masses, no thyromegaly, no carotid bruits, and no cervical lymphadenopathy.   Lungs:  normal wob, good air movement, no crackles/wheezing, full breath sounds throughout. Heart:  normal rate, regular rhythm, no murmur, and no gallop.   Pulses:  2+ rad pulses, brisk cap refill   Impression & Recommendations:  Problem # 1:  ACUTE BRONCHITIS (ICD-466.0) Assessment New in setting of h/o asthma.  PEF = 550 L/sec.  no wheezing on exam.  hold off on steroids for now.   cover with zpack given h/o bad bronchitis in past.   advised to call us if not improving on this regimen, would likely add steroids. refilled albuterol, schedule for next few days. return if red flags  His updated medication list for this problem includes:    Maxair Autohaler 200 Mcg/inh Aerb (Pirbuterol acetate) .Marland Kitchen... 2 puffs up to three times a day if needed    Pulmicort Flexhaler 180 Mcg/act Inha (Budesonide (inhalation)) .Marland Kitchen..Marland Kitchen Two puffs  a day    Albuterol Sulfate (2.5 Mg/66ml) 0.083% Nebu (Albuterol sulfate) .Marland Kitchen... 1 vial via nebulizer up to four times a  day for asthma flare    Zithromax Z-pak 250 Mg Tabs (Azithromycin) ..... Use as directed  Complete Medication List: 1)  Maxair Autohaler 200 Mcg/inh Aerb (Pirbuterol acetate) .... 2 puffs up to three times a day if needed 2)  Pulmicort Flexhaler 180 Mcg/act Inha (Budesonide (inhalation)) .... Two puffs  a day 3)  Sertraline Hcl 50 Mg Tabs (Sertraline hcl) .... Take 1 tablet by mouth once a day [currently not taking 03/2010] 4)  Tramadol Hcl 50 Mg Tabs (Tramadol hcl) .Marland Kitchen.. 1 tab three times a day as needed for back pain 5)  [facility] Ibuprofen 200 Mg Tabs (Ibuprofen) .... As needed pain 6)  Albuterol Sulfate (2.5 Mg/22ml) 0.083% Nebu (Albuterol sulfate) .Marland Kitchen.. 1 vial via nebulizer up to four times a day for asthma flare 7)  Alprazolam 0.25 Mg Tabs (Alprazolam) .... Take 1 by mouth two times a day as needed nerves [currently not taking 03/2010] 8)  Zithromax Z-pak 250 Mg Tabs (Azithromycin) .... Use as directed  Patient Instructions: 1)  refilled albuterol.  continue pulmicort daily. 2)  Schedule albuterol regularly every 4-6 hours for next 2-3 days. 3)  Take course of antibiotic (zpack). 4)  take mucinex with plenty of fluid. 5)  if any fevers >101.5, or worsening cough/breathing, please return to be seen.  if not improving, call us for steroid course. 6)  Call clinic with questions. Prescriptions: ZITHROMAX Z-PAK 250 MG TABS (AZITHROMYCIN) use as directed  #1 x 0   Entered and Authorized by:   Eustaquio Boyden  MD   Signed by:   Eustaquio Boyden  MD on 03/26/2010   Method used:   Electronically to        [facility] [phone]* (retail)       [address]       [place], [place]  [address]       Ph: [phone] or [phone]       Fax: [phone]   RxID:   [id number] ALBUTEROL SULFATE (2.5 MG/3ML) 0.083% NEBU (ALBUTEROL SULFATE) 1 vial via nebulizer up to four times a day for asthma flare  #60 x 0   Entered and Authorized by:   Eustaquio Boyden  MD   Signed  by:   Eustaquio Boyden  MD on 03/26/2010   Method used:   Electronically to  [facility] [phone]* (retail)       [address]       [place], [place]  [address]       Ph: [phone] or [phone]       Fax: [phone]   RxID:   [id number]    Orders Added: 1)  Est. Patient Level III [34742]    Current Allergies (reviewed today): ! CIPRO (CIPROFLOXACIN HCL) * IV DYES

## 2010-04-14 NOTE — Letter (Signed)
Summary: [facility]   Imported By: Kassie Mends 04/07/2010 09:00:55  _____________________________________________________________________  External Attachment:    Type:   Image     Comment:   External Document  Appended Document: [facility]     Clinical Lists Changes  Observations: Added new observation of PAST SURG HX: Pneumonia 1995 Disc/Laminectomy 1997 Gastroeenteritis  06/02 Lumbar spine surgery x 2  --fusion, etc  2007 Left knee arthroscopy  2/12 (04/07/2010 14:10)       Past History:  Past Surgical History: Pneumonia 1995 Disc/Laminectomy 1997 Gastroeenteritis  06/02 Lumbar spine surgery x 2  --fusion, etc  2007 Left knee arthroscopy  2/12

## 2010-04-20 ENCOUNTER — Other Ambulatory Visit: Payer: Self-pay | Admitting: Internal Medicine

## 2010-05-09 ENCOUNTER — Other Ambulatory Visit: Payer: Self-pay | Admitting: Family Medicine

## 2010-07-28 ENCOUNTER — Other Ambulatory Visit: Payer: Self-pay | Admitting: Internal Medicine

## 2010-08-06 ENCOUNTER — Encounter: Payer: Self-pay | Admitting: Internal Medicine

## 2010-08-07 ENCOUNTER — Encounter: Payer: Self-pay | Admitting: Internal Medicine

## 2010-08-07 ENCOUNTER — Ambulatory Visit (INDEPENDENT_AMBULATORY_CARE_PROVIDER_SITE_OTHER): Payer: BC Managed Care – PPO | Admitting: Internal Medicine

## 2010-08-07 DIAGNOSIS — F411 Generalized anxiety disorder: Secondary | ICD-10-CM

## 2010-08-07 DIAGNOSIS — Z0000 Encounter for general adult medical examination without abnormal findings: Secondary | ICD-10-CM | POA: Insufficient documentation

## 2010-08-07 DIAGNOSIS — G4733 Obstructive sleep apnea (adult) (pediatric): Secondary | ICD-10-CM

## 2010-08-07 DIAGNOSIS — K219 Gastro-esophageal reflux disease without esophagitis: Secondary | ICD-10-CM

## 2010-08-07 DIAGNOSIS — J45909 Unspecified asthma, uncomplicated: Secondary | ICD-10-CM

## 2010-08-07 LAB — BASIC METABOLIC PANEL
BUN: 18 mg/dL (ref 6–23)
Calcium: 9.4 mg/dL (ref 8.4–10.5)
Creatinine, Ser: 1 mg/dL (ref 0.4–1.5)
GFR: 85.57 mL/min (ref >60.00)

## 2010-08-07 LAB — HEPATIC FUNCTION PANEL
ALT: 31 U/L (ref 0–53)
Albumin: 4.6 g/dL (ref 3.5–5.2)
Bilirubin, Direct: 0.1 mg/dL (ref 0.0–0.3)
Total Protein: 7.6 g/dL (ref 6.0–8.3)

## 2010-08-07 LAB — CBC WITH DIFFERENTIAL/PLATELET
Eosinophils Relative: 1.2 % (ref 0.0–5.0)
Lymphocytes Relative: 34.7 % (ref 12.0–46.0)
MCV: 84.6 fl (ref 78.0–100.0)
Monocytes Absolute: 0.5 10*3/uL (ref 0.1–1.0)
Monocytes Relative: 8.4 % (ref 3.0–12.0)
Neutrophils Relative %: 55.1 % (ref 43.0–77.0)
Platelets: 230 10*3/uL (ref 150.0–400.0)
RBC: 4.84 Mil/uL (ref 4.22–5.81)
WBC: 6 10*3/uL (ref 4.5–10.5)

## 2010-08-07 NOTE — Assessment & Plan Note (Signed)
Mild and intermittent No changes needed

## 2010-08-07 NOTE — Assessment & Plan Note (Signed)
Doing well Mostly claustrophobia now so has the xanax for airplane, etc

## 2010-08-07 NOTE — Assessment & Plan Note (Signed)
Okay with nighttime OTC med

## 2010-08-07 NOTE — Assessment & Plan Note (Signed)
Generally healthy counselled on fitness, etc

## 2010-08-07 NOTE — Progress Notes (Signed)
Subjective:    Patient ID: Lee Valenzuela, male    DOB: [birth year], 50 y.o.   MRN: [id number]  HPI Doing okay No illnesses since last visit Lungs have been "great" Only used rescue inhaler once per 3 weeks or so Does forget pulmicort occ No persistent cough No exercise except for walking  Has had some left knee pain Cortisone shot last summer Had meniscus repair in February---better now  Wife concerned about sleep apnea Notes stopping of breathing occ awakens with headache Significant daytime somnolence---anytime he is "still"  Changed jobs Still Teacher, music but better company BP now normal and no headaches  Current Outpatient Prescriptions on File Prior to Visit  Medication Sig Dispense Refill  . albuterol (PROVENTIL) (2.5 MG/3ML) 0.083% nebulizer solution INHALE 1 VIAL VIA NEBULIZER UP TO 4 TIMES A DAY FOR ASTHMA FLARE  300 mL  0  . ALPRAZolam (XANAX) 0.25 MG tablet Take 0.25 mg by mouth 2 (two) times daily as needed.        . budesonide (PULMICORT FLEXHALER) 180 MCG/ACT inhaler Inhale 2 puffs into the lungs daily.        Marland Kitchen ibuprofen (ADVIL,MOTRIN) 200 MG tablet Take 200 mg by mouth as needed. For pain       . pirbuterol (MAXAIR AUTOHALER) 200 MCG/INH inhaler Inhale 2 puffs into the lungs 3 (three) times daily as needed.        . sertraline (ZOLOFT) 50 MG tablet Take 50 mg by mouth daily.        . traMADol (ULTRAM) 50 MG tablet TAKE 1 TABLET 3 TIMES A DAY AS NEEDED FOR BACK PAIN  90 tablet  0    Allergies  Allergen Reactions  . Ciprofloxacin     REACTION: ? Rash    Past Medical History  Diagnosis Date  . Asthma   . GERD (gastroesophageal reflux disease)   . Hyperlipidemia   . Lumbar disc disease   . Anxiety     with panic  . Pneumonia 1995  . Gastroenteritis 6/02    Past Surgical History  Procedure Date  . Laminectomy 1997  . Lumbar spine surgery 2007    x 2, fusion  . Knee arthroscopy 2/12    L knee  . Meniscus repair 2/12    [facility]    Family History  Problem Relation Age of Onset  . Depression Father     nervous breakdown  . Hypertension Father   . Ulcers Father   . Bipolar disorder Son   . Schizophrenia Son   . Other Sister     nervous breakdown  . Allergies Other     In family  . Coronary artery disease Neg Hx   . Diabetes Neg Hx   . Ovarian cancer Paternal Grandmother   . Lung cancer Maternal Grandfather   . Migraines Neg Hx     History   Social History  . Marital Status: Married    Spouse Name: N/A    Number of Children: 3  . Years of Education: N/A   Occupational History  . 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 Smoking status: Never Smoker   . Smokeless tobacco: Not on file  . Alcohol Use: No  . Drug Use: Not on file  . Sexually Active: Not on file   Other Topics Concern  . Not on file   Social History Narrative  .  No narrative on file   Review of Systems  Constitutional: Positive for fatigue. Negative for unexpected weight change.       Wears seat belt  HENT: Positive for congestion and rhinorrhea. Negative for hearing loss, dental problem and tinnitus.        Occ mild allergy symptoms--rarely needs meds Recurrent canker sores Regular with dentist  Eyes: Negative for visual disturbance.       No diplopia or focal vision loss  Respiratory: Negative for cough, chest tightness and shortness of breath.   Cardiovascular: Negative for chest pain, palpitations and leg swelling.  Gastrointestinal: Negative for nausea, vomiting, abdominal pain, constipation and blood in stool.       Uses ranitidine at bedtime--this controls his reflux   Genitourinary: Negative for dysuria, frequency and difficulty urinating.       No sexual problems  Musculoskeletal: Positive for back pain and arthralgias. Negative for joint swelling.       Back has improved Still uses tramadol at times  Skin: Negative for rash.       No  suspicious areas  Neurological: Positive for numbness. Negative for dizziness, syncope, weakness, light-headedness and headaches.       Occ numbness in feet--relates to back   Hematological: Negative for adenopathy. Does not bruise/bleed easily.  Psychiatric/Behavioral: Positive for sleep disturbance. Negative for dysphoric mood. The patient is nervous/anxious.        Troubled son now living in [place] and more independent Notes more claustrophobia---concerned about an upcoming plane ride       Objective:   Physical Exam  Constitutional: He is oriented to person, place, and time. He appears well-developed and well-nourished. No distress.  HENT:  Head: Normocephalic and atraumatic.  Right Ear: External ear normal.  Left Ear: External ear normal.  Mouth/Throat: Oropharynx is clear and moist. No oropharyngeal exudate.       TMs normal  Eyes: Conjunctivae and EOM are normal. Pupils are equal, round, and reactive to light.       Fundi benign  Neck: Normal range of motion. Neck supple. No thyromegaly present.  Cardiovascular: Normal rate, regular rhythm, normal heart sounds and intact distal pulses.  Exam reveals no gallop.   No murmur heard. Pulmonary/Chest: Effort normal and breath sounds normal. No respiratory distress. He has no wheezes. He has no rales.  Abdominal: Soft. He exhibits no mass. There is no tenderness.  Musculoskeletal: Normal range of motion. He exhibits no edema and no tenderness.  Lymphadenopathy:    He has no cervical adenopathy.  Neurological: He is alert and oriented to person, place, and time. He exhibits normal muscle tone.       Normal gait and strength  Skin: Skin is warm. No rash noted.       Multiple benign nevi  Psychiatric: He has a normal mood and affect. His behavior is normal. Judgment and thought content normal.          Assessment & Plan:

## 2010-08-07 NOTE — Assessment & Plan Note (Signed)
Pretty clear cut history Will send for eval

## 2010-08-07 NOTE — Patient Instructions (Signed)
Please set up the appointment with sleep specialist

## 2010-08-31 ENCOUNTER — Other Ambulatory Visit: Payer: Self-pay | Admitting: *Deleted

## 2010-08-31 MED ORDER — ALPRAZOLAM 0.25 MG PO TABS: 0.2500 mg | ORAL_TABLET | Freq: Two times a day (BID) | ORAL | Status: DC | PRN

## 2010-08-31 NOTE — Telephone Encounter (Signed)
[facility] [place]

## 2010-08-31 NOTE — Telephone Encounter (Signed)
[facility]axed request from [facility] [place] is on your desk.

## 2010-08-31 NOTE — Telephone Encounter (Signed)
Okay #60 x 0 

## 2010-09-07 ENCOUNTER — Ambulatory Visit (INDEPENDENT_AMBULATORY_CARE_PROVIDER_SITE_OTHER): Payer: BC Managed Care – PPO | Admitting: Pulmonary Disease

## 2010-09-07 ENCOUNTER — Encounter: Payer: Self-pay | Admitting: Pulmonary Disease

## 2010-09-07 VITALS — BP 138/88 | HR 78 | Temp 98.1°F | Ht 75.0 in | Wt 256.6 lb

## 2010-09-07 DIAGNOSIS — G4733 Obstructive sleep apnea (adult) (pediatric): Secondary | ICD-10-CM

## 2010-09-07 NOTE — Progress Notes (Signed)
  Subjective:    Patient ID: Lee Valenzuela, male    DOB: 21-Jul-1960, 50 y.o.   MRN: [id number]  HPI The patient is a 50 year old male who I been asked to see for possible obstructive sleep apnea.  His history is significant for the following: -Loud snoring, witnessed apneas, as well as choking arousals during the night. -Non-restorative sleep, including early morning headaches. -He is able to stay awake at work due to significant stimulation, but will often take a nap in his car at lunch. -falls asleep easily with reading and watching television/movies.  Also notes sleep pressure with driving longer distances. -weight is up 15 pounds over the last 2 years, and upper score today is 15.  Sleep Questionnaire: What time do you typically go to bed?( Between what hours) 9:00 pm to 10:00 pm How long does it take you to fall asleep? 5 mins How many times during the night do you wake up? 5 What time do you get out of bed to start your day? 0600 Do you drive or operate heavy machinery in your occupation? No How much has your weight changed (up or down) over the past two years? (In pounds) 15 lb (6.804 kg) Have you ever had a sleep study before? No Do you currently use CPAP? No Do you wear oxygen at any time? No    Review of Systems  Constitutional: Negative for fever and unexpected weight change.  HENT: Negative for ear pain, nosebleeds, congestion, sore throat, rhinorrhea, sneezing, trouble swallowing, dental problem, postnasal drip and sinus pressure.   Eyes: Negative for redness and itching.  Respiratory: Negative for cough, chest tightness, shortness of breath and wheezing.   Cardiovascular: Negative for palpitations and leg swelling.  Gastrointestinal: Negative for nausea and vomiting.  Genitourinary: Negative for dysuria.  Musculoskeletal: Negative for joint swelling.  Skin: Negative for rash.  Neurological: Positive for headaches.  Hematological: Does not bruise/bleed easily.    Psychiatric/Behavioral: Negative for dysphoric mood. The patient is not nervous/anxious.        Objective:   Physical Exam Constitutional:  Well developed, no acute distress  HENT:  Nares patent without discharge, turbinate hypertrophy, narrowed bilat  Oropharynx without exudate, palate and uvula are moderately elongated  Eyes:  Perrla, eomi, no scleral icterus  Neck:  No JVD, no TMG  Cardiovascular:  Normal rate, regular rhythm, no rubs or gallops.  No murmurs        Intact distal pulses  Pulmonary :  Normal breath sounds, no stridor or respiratory distress   No rales, rhonchi, or wheezing  Abdominal:  Soft, nondistended, bowel sounds present.  No tenderness noted.   Musculoskeletal:  No lower extremity edema noted.  Lymph Nodes:  No cervical lymphadenopathy noted  Skin:  No cyanosis noted  Neurologic:  Alert, appropriate, moves all 4 extremities without obvious deficit.         Assessment & Plan:

## 2010-09-07 NOTE — Assessment & Plan Note (Signed)
The patient's history is most consistent with significant OSA.  He has loud snoring with witnessed apneas at night, nonrestorative sleep, and significant daytime sleepiness.I have had a long discussion with the pt about sleep apnea, including its impact on QOL and CV health.  I think the patient would benefit from nocturnal polysomnography at this time, and he is agreeable.

## 2010-09-07 NOTE — Patient Instructions (Signed)
Will set up for sleep study, and arrange for followup once results available. Work on weight loss.

## 2010-09-10 ENCOUNTER — Other Ambulatory Visit: Payer: Self-pay | Admitting: *Deleted

## 2010-09-10 MED ORDER — TRAMADOL HCL 50 MG PO TABS: 50.0000 mg | ORAL_TABLET | Freq: Three times a day (TID) | ORAL | Status: DC | PRN

## 2010-09-10 NOTE — Telephone Encounter (Signed)
rx sent to pharmacy by e-script  

## 2010-09-23 ENCOUNTER — Ambulatory Visit (HOSPITAL_BASED_OUTPATIENT_CLINIC_OR_DEPARTMENT_OTHER): Payer: BC Managed Care – PPO | Attending: Pulmonary Disease

## 2010-09-23 DIAGNOSIS — G4733 Obstructive sleep apnea (adult) (pediatric): Secondary | ICD-10-CM

## 2010-10-08 DIAGNOSIS — G4733 Obstructive sleep apnea (adult) (pediatric): Secondary | ICD-10-CM

## 2010-10-08 NOTE — Procedures (Signed)
[name], Lee Valenzuela             ACCOUNT NO.:  [id number]  MEDICAL RECORD NO.:  [id number]          PATIENT TYPE:  OUT  LOCATION:  SLEEP CENTER                 FACILITY:  [facility]  PHYSICIAN:  Barbaraann Share, MD,FCCPDATE OF BIRTH:  06-08-62  DATE OF STUDY:  09/23/2010                           NOCTURNAL POLYSOMNOGRAM  REFERRING PHYSICIAN:  Barbaraann Share, MD,FCCP  INDICATION FOR STUDY:  Hypersomnia with sleep apnea.  EPWORTH SLEEPINESS SCORE:  11.  MEDICATIONS:  SLEEP ARCHITECTURE:  The patient had a total sleep time of 385 minutes with minimal slow wave sleep, but 162 minutes of REM.  Sleep onset latency was normal at 6.5 minutes and REM onset was normal at 56 minutes.  Sleep efficiency was excellent at 95%.  RESPIRATORY DATA:  The patient was found to have 88 apneas and 112 obstructive hypopneas, giving him an apnea/hypopnea index of 31 events per hour.  The events were more prominent in the supine position, and there was very loud snoring noted throughout.  OXYGEN DATA:  There was O2 desaturation as low as 71% with the patient's obstructive events.  CARDIAC DATA:  No clinically significant arrhythmias were noted.  MOVEMENT-PARASOMNIA:  The patient had no significant leg jerks or other abnormal behaviors.  IMPRESSIONS-RECOMMENDATIONS:  Moderate obstructive sleep apnea/hypopnea syndrome with an AHI of 31 events per hour and O2 desaturation as low as 71%.  Treatment for this degree of some sleep apnea can include a trial of weight loss alone, upper airway surgery, dental appliance, and also CPAP.  However, would strongly encourage treatment with CPAP while working on weight reduction.     Barbaraann Share, MD,FCCP Diplomate, American Board of Sleep Medicine Electronically Signed   [facility]  D:  10/08/2010 08:16:26  T:  10/08/2010 16:10:96  Job:  [id number]

## 2010-10-09 ENCOUNTER — Telehealth: Payer: Self-pay | Admitting: *Deleted

## 2010-10-09 NOTE — Telephone Encounter (Signed)
Pt needs ov with KC to discuss sleep study results.  Called and spoke with pt and he is scheduled to see [facility] on 10-13-2010 at 10:45 am

## 2010-10-13 ENCOUNTER — Ambulatory Visit (INDEPENDENT_AMBULATORY_CARE_PROVIDER_SITE_OTHER): Payer: BC Managed Care – PPO | Admitting: Pulmonary Disease

## 2010-10-13 ENCOUNTER — Encounter: Payer: Self-pay | Admitting: Pulmonary Disease

## 2010-10-13 VITALS — BP 138/92 | HR 63 | Temp 98.3°F | Ht 75.0 in | Wt 258.0 lb

## 2010-10-13 DIAGNOSIS — G4733 Obstructive sleep apnea (adult) (pediatric): Secondary | ICD-10-CM

## 2010-10-13 NOTE — Patient Instructions (Signed)
Will start on cpap at moderate level.  Please call if having issues with tolerance Work on weight loss followup with me in 5 weeks.  

## 2010-10-13 NOTE — Assessment & Plan Note (Signed)
The patient has moderate obstructive sleep apnea by his recent sleep study, but is very symptomatic.  I discussed the various treatment options with him, including dental appliance and CPAP while working on weight loss.  I really don't think surgery is in his best interest at this time.  After a long discussion, he and I both agreed with trying CPAP first.  I will set the patient up on cpap at a moderate pressure level to allow for desensitization, and will troubleshoot the device over the next 4-6weeks if needed.  The pt is to call me if having issues with tolerance.  Will then optimize the pressure once patient is able to wear cpap on a consistent basis.

## 2010-10-13 NOTE — Progress Notes (Signed)
  Subjective:    Patient ID: Lee Valenzuela, male    DOB: Jan 11, 1961, 50 y.o.   MRN: [id number]  HPI The patient in today for followup of his recent sleep study.  He was found to have moderate sleep apnea with an AHI of 31 events per hour and desaturations as low as 71%.  I have reviewed this study with him in detail, and answered all of his questions.   Review of Systems  Constitutional: Negative for fever and unexpected weight change.  HENT: Negative for ear pain, nosebleeds, congestion, sore throat, rhinorrhea, sneezing, trouble swallowing, dental problem, postnasal drip and sinus pressure.   Eyes: Negative for redness and itching.  Respiratory: Negative for cough, chest tightness, shortness of breath and wheezing.   Cardiovascular: Negative for palpitations and leg swelling.  Gastrointestinal: Negative for nausea and vomiting.  Genitourinary: Negative for dysuria.  Musculoskeletal: Negative for joint swelling.  Skin: Negative for rash.  Neurological: Negative for headaches.  Hematological: Does not bruise/bleed easily.  Psychiatric/Behavioral: Negative for dysphoric mood. The patient is not nervous/anxious.        Objective:   Physical Exam Obese male in no acute distress Nose without discharge or purulence noted Lower extremities without edema, no cyanosis Alert and oriented, moves all 4 extremities.       Assessment & Plan:

## 2010-10-16 ENCOUNTER — Other Ambulatory Visit: Payer: Self-pay | Admitting: Internal Medicine

## 2010-10-16 NOTE — Telephone Encounter (Signed)
rx sent to pharmacy by e-script  

## 2010-11-20 ENCOUNTER — Ambulatory Visit: Payer: BC Managed Care – PPO | Admitting: Pulmonary Disease

## 2010-11-23 ENCOUNTER — Other Ambulatory Visit: Payer: Self-pay | Admitting: Internal Medicine

## 2010-11-23 ENCOUNTER — Ambulatory Visit (INDEPENDENT_AMBULATORY_CARE_PROVIDER_SITE_OTHER): Payer: BC Managed Care – PPO | Admitting: Pulmonary Disease

## 2010-11-23 ENCOUNTER — Encounter: Payer: Self-pay | Admitting: Pulmonary Disease

## 2010-11-23 VITALS — BP 138/72 | HR 77 | Temp 98.5°F | Ht 75.0 in | Wt 256.0 lb

## 2010-11-23 DIAGNOSIS — G4733 Obstructive sleep apnea (adult) (pediatric): Secondary | ICD-10-CM

## 2010-11-23 NOTE — Assessment & Plan Note (Signed)
The patient is doing very well with CPAP, and has seen great improvement in his sleep and daytime alertness.  He has had no tolerance issues.  I have explained to the patient that we need to optimize his pressure for him, and can do this at home on the auto setting.  Care Plan:  At this point, will arrange for the patient's machine to be changed over to auto mode for 2 weeks to optimize their pressure.  I will review the downloaded data once sent by dme, and also evaluate for compliance, leaks, and residual osa.  I will call the patient and dme to discuss the results, and have the patient's machine set appropriately.  This will serve as the pt's cpap pressure titration.

## 2010-11-23 NOTE — Progress Notes (Signed)
  Subjective:    Patient ID: Lee Valenzuela, male    DOB: 08/04/1960, 50 y.o.   MRN: [id number]  HPI The patient comes in today for followup of his known sleep apnea.  He has been wearing CPAP compliantly at a moderate pressure level, and feels that he is doing very well with the device.  He denies any mask or pressure issues, and feels that his sleep is greatly improved.  He has also noticed improved daytime alertness.   Review of Systems  Constitutional: Negative for fever and unexpected weight change.  HENT: Negative for ear pain, nosebleeds, congestion, sore throat, rhinorrhea, sneezing, trouble swallowing, dental problem, postnasal drip and sinus pressure.   Eyes: Negative for redness and itching.  Respiratory: Negative for cough, chest tightness, shortness of breath and wheezing.   Cardiovascular: Negative for palpitations and leg swelling.  Gastrointestinal: Negative for nausea and vomiting.  Genitourinary: Negative for dysuria.  Musculoskeletal: Negative for joint swelling.  Skin: Negative for rash.  Neurological: Negative for headaches.  Hematological: Does not bruise/bleed easily.  Psychiatric/Behavioral: Negative for dysphoric mood. The patient is not nervous/anxious.        Objective:   Physical Exam Overweight male in no acute distress No skin breakdown or pressure necrosis from the CPAP mask Lower extremities without edema, no cyanosis noted Alert and oriented, does not appear to be sleepy, moves all 4 extremities.       Assessment & Plan:

## 2010-11-23 NOTE — Patient Instructions (Signed)
Will optimize your pressure on the auto setting for 2 weeks, and will let you know the results.  Work on weight loss followup with me in 6mos, but call if having issues.

## 2010-12-28 ENCOUNTER — Other Ambulatory Visit: Payer: Self-pay | Admitting: Internal Medicine

## 2010-12-28 NOTE — Telephone Encounter (Signed)
Rx sent electronically.  

## 2010-12-28 NOTE — Telephone Encounter (Signed)
Ok to fill 

## 2011-02-01 ENCOUNTER — Other Ambulatory Visit: Payer: Self-pay | Admitting: Internal Medicine

## 2011-02-01 NOTE — Telephone Encounter (Signed)
Okay #90 x 0 

## 2011-02-01 NOTE — Telephone Encounter (Signed)
Ok to refill 

## 2011-02-09 ENCOUNTER — Other Ambulatory Visit: Payer: Self-pay | Admitting: *Deleted

## 2011-02-09 MED ORDER — ALPRAZOLAM 0.25 MG PO TABS: 0.2500 mg | ORAL_TABLET | Freq: Two times a day (BID) | ORAL | Status: DC | PRN

## 2011-02-09 NOTE — Telephone Encounter (Signed)
rx called into pharmacy

## 2011-02-09 NOTE — Telephone Encounter (Signed)
Received faxed refill request from pharmacy. Is it okay to refill medication? 

## 2011-02-09 NOTE — Telephone Encounter (Signed)
Okay #60 x 0 

## 2011-02-21 ENCOUNTER — Other Ambulatory Visit: Payer: Self-pay | Admitting: Pulmonary Disease

## 2011-02-21 DIAGNOSIS — G4733 Obstructive sleep apnea (adult) (pediatric): Secondary | ICD-10-CM

## 2011-03-09 ENCOUNTER — Other Ambulatory Visit: Payer: Self-pay | Admitting: Internal Medicine

## 2011-03-09 NOTE — Telephone Encounter (Signed)
Okay #90 x 0 

## 2011-03-09 NOTE — Telephone Encounter (Signed)
rx called into pharmacy

## 2011-03-29 ENCOUNTER — Emergency Department: Payer: Self-pay | Admitting: *Deleted

## 2011-03-29 LAB — URINALYSIS, COMPLETE
Bacteria: NONE SEEN
Bilirubin,UR: NEGATIVE
Glucose,UR: NEGATIVE mg/dL (ref 0–75)
Leukocyte Esterase: NEGATIVE
Nitrite: NEGATIVE
RBC,UR: 2 /HPF (ref 0–5)
WBC UR: 1 /HPF (ref 0–5)

## 2011-03-29 LAB — COMPREHENSIVE METABOLIC PANEL
Albumin: 4.6 g/dL (ref 3.4–5.0)
Alkaline Phosphatase: 53 U/L (ref 50–136)
Anion Gap: 10 (ref 7–16)
BUN: 14 mg/dL (ref 7–18)
Calcium, Total: 9.7 mg/dL (ref 8.5–10.1)
Chloride: 103 mmol/L (ref 98–107)
Co2: 27 mmol/L (ref 21–32)
Creatinine: 1.07 mg/dL (ref 0.60–1.30)
EGFR (African American): >60
Potassium: 4.2 mmol/L (ref 3.5–5.1)
Sodium: 140 mmol/L (ref 136–145)
Total Protein: 8.2 g/dL (ref 6.4–8.2)

## 2011-03-29 LAB — CBC
MCH: 28.6 pg (ref 26.0–34.0)
MCV: 85 fL (ref 80–100)
Platelet: 216 10*3/uL (ref 150–440)
RDW: 13.5 % (ref 11.5–14.5)
WBC: 12.2 10*3/uL — ABNORMAL HIGH (ref 3.8–10.6)

## 2011-04-19 ENCOUNTER — Other Ambulatory Visit: Payer: Self-pay | Admitting: Internal Medicine

## 2011-04-20 NOTE — Telephone Encounter (Signed)
Okay #90 x 0 Have him set up PE after July 13th (and by the fall sometime)

## 2011-04-20 NOTE — Telephone Encounter (Signed)
rx sent to pharmacy by e-script Left message asking pt to schedule appt after July

## 2011-05-20 ENCOUNTER — Other Ambulatory Visit: Payer: Self-pay | Admitting: Internal Medicine

## 2011-05-20 NOTE — Telephone Encounter (Signed)
rx sent to pharmacy by e-script  

## 2011-05-20 NOTE — Telephone Encounter (Signed)
No future appts scheduled last seen 07/2010, please advise

## 2011-05-20 NOTE — Telephone Encounter (Signed)
Okay #90 x 0 He needs to schedule physical soon after 08/07/11 (the 1 year date)

## 2011-06-20 ENCOUNTER — Other Ambulatory Visit: Payer: Self-pay | Admitting: Internal Medicine

## 2011-06-24 ENCOUNTER — Other Ambulatory Visit: Payer: Self-pay | Admitting: *Deleted

## 2011-06-24 MED ORDER — TRAMADOL HCL 50 MG PO TABS: 50.0000 mg | ORAL_TABLET | Freq: Three times a day (TID) | ORAL | Status: DC | PRN

## 2011-06-24 NOTE — Telephone Encounter (Signed)
Patient scheduled CPX on 08/30/11 @ 3:30pm, ok to refill?

## 2011-06-24 NOTE — Telephone Encounter (Signed)
rx sent to pharmacy by e-script  

## 2011-06-24 NOTE — Telephone Encounter (Signed)
Okay #90 x 0 

## 2011-07-28 ENCOUNTER — Other Ambulatory Visit: Payer: Self-pay | Admitting: Internal Medicine

## 2011-07-28 NOTE — Telephone Encounter (Signed)
Okay #90 x 0 

## 2011-07-28 NOTE — Telephone Encounter (Signed)
rx sent to pharmacy by e-script  

## 2011-08-30 ENCOUNTER — Encounter: Payer: BC Managed Care – PPO | Admitting: Internal Medicine

## 2011-09-02 ENCOUNTER — Other Ambulatory Visit: Payer: Self-pay | Admitting: Internal Medicine

## 2011-09-02 NOTE — Telephone Encounter (Signed)
pts wife left v/m requesting tramadol refill to [facility]. Pt has 1/2 pill.Please advise.

## 2011-09-03 ENCOUNTER — Other Ambulatory Visit: Payer: Self-pay | Admitting: *Deleted

## 2011-09-03 MED ORDER — TRAMADOL HCL 50 MG PO TABS: 50.0000 mg | ORAL_TABLET | Freq: Three times a day (TID) | ORAL | Status: DC

## 2011-09-03 NOTE — Telephone Encounter (Signed)
Wife calling upset because pt ran out of Tramadol and per wife he needs this everyday, ok to fill? I denied rx and called pt to schedule  appt because his last appt stated "patient canceled" the appt and per the front desk WE canceled the appt because Dr.Letvak was out of office. I also advised wife pt was last seen 07/2010 and needs a CPX, CPX scheduled for 10/06/11.

## 2011-09-03 NOTE — Telephone Encounter (Signed)
Okay to fill #90 x 0 

## 2011-09-03 NOTE — Telephone Encounter (Signed)
rx sent to pharmacy by e-script  

## 2011-10-01 ENCOUNTER — Other Ambulatory Visit: Payer: Self-pay | Admitting: *Deleted

## 2011-10-01 MED ORDER — TRAMADOL HCL 50 MG PO TABS: 50.0000 mg | ORAL_TABLET | Freq: Three times a day (TID) | ORAL | Status: DC

## 2011-10-06 ENCOUNTER — Encounter: Payer: Self-pay | Admitting: Internal Medicine

## 2011-10-06 ENCOUNTER — Ambulatory Visit (INDEPENDENT_AMBULATORY_CARE_PROVIDER_SITE_OTHER): Payer: BC Managed Care – PPO | Admitting: Internal Medicine

## 2011-10-06 VITALS — BP 128/88 | HR 70 | Temp 97.8°F | Ht 73.5 in | Wt 260.0 lb

## 2011-10-06 DIAGNOSIS — J45909 Unspecified asthma, uncomplicated: Secondary | ICD-10-CM

## 2011-10-06 DIAGNOSIS — Z0000 Encounter for general adult medical examination without abnormal findings: Secondary | ICD-10-CM

## 2011-10-06 DIAGNOSIS — E785 Hyperlipidemia, unspecified: Secondary | ICD-10-CM

## 2011-10-06 DIAGNOSIS — M5137 Other intervertebral disc degeneration, lumbosacral region: Secondary | ICD-10-CM

## 2011-10-06 DIAGNOSIS — F411 Generalized anxiety disorder: Secondary | ICD-10-CM

## 2011-10-06 DIAGNOSIS — K219 Gastro-esophageal reflux disease without esophagitis: Secondary | ICD-10-CM

## 2011-10-06 LAB — TSH: TSH: 0.99 u[IU]/mL (ref 0.35–5.50)

## 2011-10-06 LAB — CBC WITH DIFFERENTIAL/PLATELET
Eosinophils Absolute: 0.2 10*3/uL (ref 0.0–0.7)
Eosinophils Relative: 2.7 % (ref 0.0–5.0)
HCT: 42.4 % (ref 39.0–52.0)
Lymphs Abs: 1.7 10*3/uL (ref 0.7–4.0)
MCHC: 33.1 g/dL (ref 30.0–36.0)
MCV: 86.6 fl (ref 78.0–100.0)
Monocytes Absolute: 0.5 10*3/uL (ref 0.1–1.0)
Neutrophils Relative %: 57.1 % (ref 43.0–77.0)
Platelets: 218 10*3/uL (ref 150.0–400.0)
RDW: 13.2 % (ref 11.5–14.6)
WBC: 5.6 10*3/uL (ref 4.5–10.5)

## 2011-10-06 LAB — VITAMIN B12: Vitamin B-12: 166 pg/mL — ABNORMAL LOW (ref 211–911)

## 2011-10-06 NOTE — Assessment & Plan Note (Signed)
Really unhealthy eating Discussed improving his eating Needs to exercise more

## 2011-10-06 NOTE — Progress Notes (Signed)
Subjective:    Patient ID: Lee Valenzuela, male    DOB: 1960/05/08, 51 y.o.   MRN: [id number]  HPI Here for physical Did have sleep apnea and doing well on CPAP Same job---some stress but not bad  Anxiety has been controlled.  No meds for this  Noted "holes" in his vision --related to sertraline. This has resolved Hasn't needed the alprazolam even when flying in small airplane  Asthma has been controlled Feels the ragweed is causing some problems---has recently need neb or maxair Not consistent with steroid inhaler---discussed using it regularly in allergy season  Uses tramadol for his pain regularly Back pain is better since surgery but gets burning in feet---has not tolerated weaning his tramadol (tid)  No heartburn issues Using ranitidine nightly and it controls symptoms Feels the CPAP helps this as well  Eats too much Wife is great cook and he just can't say no  Current Outpatient Prescriptions on File Prior to Visit  Medication Sig Dispense Refill  . albuterol (PROVENTIL) (2.5 MG/3ML) 0.083% nebulizer solution INHALE 1 VIAL VIA NEBULIZER UP TO 4 TIMES A DAY FOR ASTHMA FLARE  300 mL  0  . budesonide (PULMICORT FLEXHALER) 180 MCG/ACT inhaler Inhale 2 puffs into the lungs daily.        Marland Kitchen ibuprofen (ADVIL,MOTRIN) 200 MG tablet Take 200 mg by mouth as needed. For pain       . pirbuterol (MAXAIR AUTOHALER) 200 MCG/INH inhaler Inhale 2 puffs into the lungs 3 (three) times daily as needed.        . ranitidine (ZANTAC) 150 MG tablet Take 150 mg by mouth at bedtime. For acid reflux       . traMADol (ULTRAM) 50 MG tablet Take 1 tablet (50 mg total) by mouth 3 (three) times daily.  90 tablet  0    Allergies  Allergen Reactions  . Ciprofloxacin     REACTION: ? Rash    Past Medical History  Diagnosis Date  . Asthma   . GERD (gastroesophageal reflux disease)   . Hyperlipidemia   . Lumbar disc disease   . Anxiety     with panic  . Pneumonia 1995  . Gastroenteritis 6/02     Past Surgical History  Procedure Date  . Laminectomy 1997  . Lumbar spine surgery 2007    x 2, fusion  . Knee arthroscopy 2/12    L knee  . Meniscus repair 2/12    [facility]    Family History  Problem Relation Age of Onset  . Depression Father     nervous breakdown  . Hypertension Father   . Ulcers Father   . Bipolar disorder Son   . Schizophrenia Son   . Other Sister     nervous breakdown  . Allergies Other     In family  . Coronary artery disease Neg Hx   . Diabetes Neg Hx   . Ovarian cancer Paternal Grandmother   . Liver cancer Maternal Grandfather   . Migraines Neg Hx     History   Social History  . Marital Status: Married    Spouse Name: N/A    Number of Children: 3  . Years of Education: N/A   Occupational History  . 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     Social History Main Topics  . Smoking status: Never Smoker   . Smokeless tobacco: Never Used  . Alcohol Use: No  .  Drug Use: Not on file  . Sexually Active: Not on file   Other Topics Concern  . Not on file   Social History Narrative  . No narrative on file   Review of Systems  Constitutional: Positive for unexpected weight change. Negative for fatigue.       Weight up 35# in 5 years Wears seat belt  HENT: Positive for congestion, rhinorrhea and tinnitus. Negative for hearing loss and dental problem.        Hears crickets and relates this to tramadol Regular with dentist  Eyes: Negative for visual disturbance.       No diplopia or unilateral vision loss  Respiratory: Positive for chest tightness, shortness of breath and wheezing. Negative for cough.        Asthma causes symptoms  Cardiovascular: Negative for chest pain, palpitations and leg swelling.  Gastrointestinal: Negative for nausea, vomiting, abdominal pain, constipation and blood in stool.       Heartburn controlled  Genitourinary: Positive for difficulty urinating. Negative for  urgency and frequency.       Slow stream at first at times No sexual problems  Musculoskeletal: Positive for arthralgias. Negative for back pain and joint swelling.       Elbow pains since this summer--doing a lot of work at cabin  Skin: Negative for rash.       No suspicious lesions  Neurological: Positive for numbness and headaches. Negative for dizziness, syncope, weakness and light-headedness.       Occ muscle tension headache Sensory changes just in legs  Hematological: Negative for adenopathy. Does not bruise/bleed easily.  Psychiatric/Behavioral: Negative for disturbed wake/sleep cycle and dysphoric mood. The patient is not nervous/anxious.        Objective:   Physical Exam  Constitutional: He is oriented to person, place, and time. He appears well-developed and well-nourished. No distress.  HENT:  Head: Normocephalic and atraumatic.  Right Ear: External ear normal.  Left Ear: External ear normal.  Mouth/Throat: Oropharynx is clear and moist. No oropharyngeal exudate.  Eyes: Conjunctivae normal and EOM are normal. Pupils are equal, round, and reactive to light.  Neck: Normal range of motion. Neck supple. No thyromegaly present.  Cardiovascular: Normal rate, regular rhythm, normal heart sounds and intact distal pulses.  Exam reveals no gallop.   No murmur heard. Pulmonary/Chest: Effort normal and breath sounds normal. No respiratory distress. He has no wheezes. He has no rales.  Abdominal: Soft. There is no tenderness.  Musculoskeletal: Normal range of motion. He exhibits no edema and no tenderness.       No elbow tenderness  Lymphadenopathy:    He has no cervical adenopathy.  Neurological: He is alert and oriented to person, place, and time.  Skin: No rash noted. No erythema.  Psychiatric: He has a normal mood and affect. His behavior is normal. Thought content normal.          Assessment & Plan:

## 2011-10-06 NOTE — Assessment & Plan Note (Signed)
Will recheck Discussed primary prevention--will hold off for now

## 2011-10-06 NOTE — Assessment & Plan Note (Signed)
With apparent secondary sensory loss Will continue the tramadol

## 2011-10-06 NOTE — Assessment & Plan Note (Signed)
Much better Not on meds Will leave the xanax for rare use

## 2011-10-06 NOTE — Assessment & Plan Note (Signed)
Okay on the OTC H2 blocker

## 2011-10-06 NOTE — Assessment & Plan Note (Signed)
Mild and intermittent Discussed that he needs the steroid inhaler regularly during allergy season

## 2011-10-07 LAB — BASIC METABOLIC PANEL
BUN: 20 mg/dL (ref 6–23)
CO2: 26 mEq/L (ref 19–32)
Chloride: 107 mEq/L (ref 96–112)
Creatinine, Ser: 1 mg/dL (ref 0.4–1.5)
Glucose, Bld: 94 mg/dL (ref 70–99)
Potassium: 4.1 mEq/L (ref 3.5–5.1)

## 2011-10-07 LAB — HEPATIC FUNCTION PANEL
ALT: 31 U/L (ref 0–53)
AST: 29 U/L (ref 0–37)
Albumin: 4.5 g/dL (ref 3.5–5.2)

## 2011-10-07 LAB — LIPID PANEL: Cholesterol: 236 mg/dL — ABNORMAL HIGH (ref 0–200)

## 2011-10-08 LAB — LDL CHOLESTEROL, DIRECT: Direct LDL: 167.5 mg/dL

## 2011-10-12 ENCOUNTER — Encounter: Payer: Self-pay | Admitting: *Deleted

## 2011-11-03 ENCOUNTER — Other Ambulatory Visit: Payer: Self-pay | Admitting: Internal Medicine

## 2011-11-05 ENCOUNTER — Ambulatory Visit (INDEPENDENT_AMBULATORY_CARE_PROVIDER_SITE_OTHER): Payer: 59 | Admitting: Internal Medicine

## 2011-11-05 ENCOUNTER — Encounter: Payer: Self-pay | Admitting: Internal Medicine

## 2011-11-05 VITALS — BP 120/84 | HR 68 | Temp 98.5°F | Wt 261.0 lb

## 2011-11-05 DIAGNOSIS — Z23 Encounter for immunization: Secondary | ICD-10-CM

## 2011-11-05 DIAGNOSIS — N410 Acute prostatitis: Secondary | ICD-10-CM | POA: Insufficient documentation

## 2011-11-05 DIAGNOSIS — E538 Deficiency of other specified B group vitamins: Secondary | ICD-10-CM | POA: Insufficient documentation

## 2011-11-05 LAB — VITAMIN B12: Vitamin B-12: 717 pg/mL (ref 211–911)

## 2011-11-05 MED ORDER — SULFAMETHOXAZOLE-TRIMETHOPRIM 800-160 MG PO TABS: 1.0000 | ORAL_TABLET | Freq: Two times a day (BID) | ORAL | Status: AC

## 2011-11-05 MED ORDER — PIRBUTEROL ACETATE 200 MCG/INH IN AERB: 2.0000 | INHALATION_SPRAY | Freq: Three times a day (TID) | RESPIRATORY_TRACT | Status: DC | PRN

## 2011-11-05 MED ORDER — ALBUTEROL SULFATE (2.5 MG/3ML) 0.083% IN NEBU: 2.5000 mg | INHALATION_SOLUTION | Freq: Three times a day (TID) | RESPIRATORY_TRACT | Status: DC | PRN

## 2011-11-05 NOTE — Addendum Note (Signed)
Addended by: Eliezer Bottom on: 11/05/2011 11:44 AM   Modules accepted: Orders

## 2011-11-05 NOTE — Assessment & Plan Note (Signed)
Has been on oral supplements for about 3 weeks Will recheck lab and start shots if not normal now

## 2011-11-05 NOTE — Progress Notes (Signed)
Subjective:    Patient ID: Lee Valenzuela, male    DOB: 01-08-1961, 51 y.o.   MRN: [id number]  HPI Has been taking oral B12 for about 3 weeks  Has been having pain like prostate infection 10 years ago or so Inside in perineum Can be severe at times--may radiate to tip of penis Did note increased urinary urgency and frequency on car trip yesterday Feels pressure when moves bowels  No fever No sexual problems---last 2 days ago and no pain  Current Outpatient Prescriptions on File Prior to Visit  Medication Sig Dispense Refill  . albuterol (PROVENTIL) (2.5 MG/3ML) 0.083% nebulizer solution INHALE 1 VIAL VIA NEBULIZER UP TO 4 TIMES A DAY FOR ASTHMA FLARE  300 mL  0  . ALPRAZolam (XANAX) 0.25 MG tablet Take 0.25 mg by mouth 2 (two) times daily as needed.      . budesonide (PULMICORT FLEXHALER) 180 MCG/ACT inhaler Inhale 2 puffs into the lungs daily.        Marland Kitchen ibuprofen (ADVIL,MOTRIN) 200 MG tablet Take 200 mg by mouth as needed. For pain       . pirbuterol (MAXAIR AUTOHALER) 200 MCG/INH inhaler Inhale 2 puffs into the lungs 3 (three) times daily as needed.        . ranitidine (ZANTAC) 150 MG tablet Take 150 mg by mouth at bedtime. For acid reflux       . traMADol (ULTRAM) 50 MG tablet TAKE 1 TABLET BY MOUTH 3 TIMES A DAY  90 tablet  0    Allergies  Allergen Reactions  . Ciprofloxacin     REACTION: ? Rash    Past Medical History  Diagnosis Date  . Asthma   . GERD (gastroesophageal reflux disease)   . Hyperlipidemia   . Lumbar disc disease   . Anxiety     with panic  . Pneumonia 1995  . Gastroenteritis 6/02    Past Surgical History  Procedure Date  . Laminectomy 1997  . Lumbar spine surgery 2007    x 2, fusion  . Knee arthroscopy 2/12    L knee  . Meniscus repair 2/12    [facility]    Family History  Problem Relation Age of Onset  . Depression Father     nervous breakdown  . Hypertension Father   . Ulcers Father   . Bipolar disorder Son   . Schizophrenia  Son   . Other Sister     nervous breakdown  . Allergies Other     In family  . Coronary artery disease Neg Hx   . Diabetes Neg Hx   . Ovarian cancer Paternal Grandmother   . Liver cancer Maternal Grandfather   . Migraines Neg Hx     History   Social History  . Marital Status: Married    Spouse Name: N/A    Number of Children: 3  . Years of Education: N/A   Occupational History  . 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     Social History Main Topics  . Smoking status: Never Smoker   . Smokeless tobacco: Never Used  . Alcohol Use: No  . Drug Use: Not on file  . Sexually Active: Not on file   Other Topics Concern  . Not on file   Social History Narrative  . No narrative on file   Review of Systems No nausea or vomiting Appetite is okay monogamous with wife  Objective:   Physical Exam  Constitutional: He appears well-developed and well-nourished. No distress.  Genitourinary:       Normal scrotum No urethral inflammation Rectal shows no mass but exquisitely tender prostate          Assessment & Plan:

## 2011-11-05 NOTE — Assessment & Plan Note (Signed)
Clear cut diagnosis Will give septra for 3 weeks

## 2011-11-08 ENCOUNTER — Encounter: Payer: Self-pay | Admitting: *Deleted

## 2011-11-24 ENCOUNTER — Other Ambulatory Visit: Payer: Self-pay | Admitting: Internal Medicine

## 2011-12-03 ENCOUNTER — Other Ambulatory Visit: Payer: Self-pay | Admitting: Internal Medicine

## 2011-12-03 NOTE — Telephone Encounter (Signed)
Spoke with patient and his symptoms are better and doesn't need the rx.

## 2011-12-03 NOTE — Telephone Encounter (Signed)
Called patient about refill and per pt he was told he would need to be on rx for 4 mths,  ok to refill?

## 2011-12-03 NOTE — Telephone Encounter (Signed)
I told him that we need 3 weeks of Rx If his symptoms have been better since early in the course, no more meds needed If his symptoms are only just getting better, okay to refill for 3 more weeks (#42)

## 2012-01-26 ENCOUNTER — Other Ambulatory Visit: Payer: Self-pay | Admitting: Internal Medicine

## 2012-02-28 ENCOUNTER — Other Ambulatory Visit: Payer: Self-pay | Admitting: Internal Medicine

## 2012-02-28 NOTE — Telephone Encounter (Signed)
Okay #90 x 0 

## 2012-02-28 NOTE — Telephone Encounter (Signed)
rx sent to pharmacy by e-script  

## 2012-03-28 ENCOUNTER — Other Ambulatory Visit: Payer: Self-pay | Admitting: Internal Medicine

## 2012-03-28 NOTE — Telephone Encounter (Signed)
Okay #90 x 0  He needs a PE appt after 10/05/12 He may want to set this up soon

## 2012-03-28 NOTE — Telephone Encounter (Signed)
rx sent to pharmacy by e-script  

## 2012-04-27 ENCOUNTER — Other Ambulatory Visit: Payer: Self-pay | Admitting: Internal Medicine

## 2012-05-19 ENCOUNTER — Encounter: Payer: Self-pay | Admitting: Internal Medicine

## 2012-05-19 ENCOUNTER — Telehealth: Payer: Self-pay | Admitting: Internal Medicine

## 2012-05-19 ENCOUNTER — Ambulatory Visit (INDEPENDENT_AMBULATORY_CARE_PROVIDER_SITE_OTHER): Payer: 59 | Admitting: Internal Medicine

## 2012-05-19 VITALS — BP 120/80 | HR 80 | Temp 98.0°F | Resp 16 | Wt 261.0 lb

## 2012-05-19 DIAGNOSIS — J45901 Unspecified asthma with (acute) exacerbation: Secondary | ICD-10-CM

## 2012-05-19 DIAGNOSIS — J019 Acute sinusitis, unspecified: Secondary | ICD-10-CM | POA: Insufficient documentation

## 2012-05-19 MED ORDER — AMOXICILLIN-POT CLAVULANATE 875-125 MG PO TABS: 1.0000 | ORAL_TABLET | Freq: Two times a day (BID) | ORAL | Status: DC

## 2012-05-19 MED ORDER — ALBUTEROL SULFATE HFA 108 (90 BASE) MCG/ACT IN AERS: 2.0000 | INHALATION_SPRAY | Freq: Four times a day (QID) | RESPIRATORY_TRACT | Status: DC | PRN

## 2012-05-19 MED ORDER — PREDNISONE 20 MG PO TABS: 40.0000 mg | ORAL_TABLET | Freq: Every day | ORAL | Status: DC

## 2012-05-19 NOTE — Assessment & Plan Note (Signed)
Having dyspnea and using nebs freq Sounds okay Will treat with prednisone flare

## 2012-05-19 NOTE — Progress Notes (Signed)
Subjective:    Patient ID: Lee Valenzuela, male    DOB: 07/23/1960, 52 y.o.   MRN: [id number]  HPI Has been sick for a week Using his nebulizer a lot Coughing up green sputum  Sinuses and lungs "on fire" Head congestion, ear pain  Wheezing bad Some degree of SOB Has had low grade fever Has had soaking sweats at night  Current Outpatient Prescriptions on File Prior to Visit  Medication Sig Dispense Refill  . albuterol (PROVENTIL) (2.5 MG/3ML) 0.083% nebulizer solution Take 3 mLs (2.5 mg total) by nebulization 3 (three) times daily as needed for wheezing.  300 mL  0  . ALPRAZolam (XANAX) 0.25 MG tablet Take 0.25 mg by mouth 2 (two) times daily as needed.      . budesonide (PULMICORT FLEXHALER) 180 MCG/ACT inhaler Inhale 2 puffs into the lungs daily.        Marland Kitchen ibuprofen (ADVIL,MOTRIN) 200 MG tablet Take 200 mg by mouth as needed. For pain       . pirbuterol (MAXAIR AUTOHALER) 200 MCG/INH inhaler Inhale 2 puffs into the lungs 3 (three) times daily as needed.  1 Inhaler  1  . ranitidine (ZANTAC) 150 MG tablet Take 150 mg by mouth at bedtime. For acid reflux       . traMADol (ULTRAM) 50 MG tablet TAKE 1 TABLET BY MOUTH 3 TIMES A DAY  90 tablet  0   No current facility-administered medications on file prior to visit.    Allergies  Allergen Reactions  . Ciprofloxacin     REACTION: ? Rash    Past Medical History  Diagnosis Date  . Asthma   . GERD (gastroesophageal reflux disease)   . Hyperlipidemia   . Lumbar disc disease   . Anxiety     with panic  . Pneumonia 1995  . Gastroenteritis 6/02    Past Surgical History  Procedure Laterality Date  . Laminectomy  1997  . Lumbar spine surgery  2007    x 2, fusion  . Knee arthroscopy  2/12    L knee  . Meniscus repair  2/12    [facility]    Family History  Problem Relation Age of Onset  . Depression Father     nervous breakdown  . Hypertension Father   . Ulcers Father   . Bipolar disorder Son   . Schizophrenia  Son   . Other Sister     nervous breakdown  . Allergies Other     In family  . Coronary artery disease Neg Hx   . Diabetes Neg Hx   . Ovarian cancer Paternal Grandmother   . Liver cancer Maternal Grandfather   . Migraines Neg Hx     History   Social History  . Marital Status: Married    Spouse Name: N/A    Number of Children: 3  . Years of Education: N/A   Occupational History  . 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     Social History Main Topics  . Smoking status: Never Smoker   . Smokeless tobacco: Never Used  . Alcohol Use: No  . Drug Use: Not on file  . Sexually Active: Not on file   Other Topics Concern  . Not on file   Social History Narrative  . No narrative on file   Review of Systems Has to sleep sitting up in chair---will awaken with wheezing and need to use neb  at night No rash No nausea or vomiting or diarrhea Appetite is gone    Objective:   Physical Exam  Constitutional: He appears well-developed and well-nourished.  Looks miserable but NAD  HENT:  Mouth/Throat: Oropharynx is clear and moist. No oropharyngeal exudate.  No sinus tenderness Moderate nasal inflammation TMs not inflamed but apparent serous otitis at least on the left  Neck: Neck supple. No thyromegaly present.  Pulmonary/Chest: Effort normal. No respiratory distress. He has no wheezes. He has no rales.  No dullness Not tight but may have slightly decreased breath sounds  Musculoskeletal: He exhibits no edema.  Lymphadenopathy:    He has no cervical adenopathy.          Assessment & Plan:

## 2012-05-19 NOTE — Telephone Encounter (Signed)
Patient Information:  Caller Name: Darrold  Phone: [phone]  Patient: Lee Valenzuela, Lee Valenzuela  Gender: Male  DOB: [birth year]  Age: 52 Years  PCP: Tillman Abide [facility])  Office Follow Up:  Does the office need to follow up with this patient?: No  Instructions For The Office: N/A  RN Note:  pt states he has a history of pneumonia and is worried this is going to turn into that again.  Symptoms  Reason For Call & Symptoms: pt reports he has been sick for a week with congestion.  Pt reports he has had fevers off and on.  Reviewed Health History In EMR: Yes  Reviewed Medications In EMR: Yes  Reviewed Allergies In EMR: Yes  Reviewed Surgeries / Procedures: Yes  Date of Onset of Symptoms: 05/12/2012  Treatments Tried: neb treatments  Treatments Tried Worked: No  Guideline(s) Used:  Sinus Pain and Congestion  Disposition Per Guideline:   See Today in Office  Reason For Disposition Reached:   Sinus pain (not just congestion) and fever  Advice Given:  Call Back If:   You become worse.  Patient Will Follow Care Advice:  YES  Appointment Scheduled:  05/19/2012 11:00:00 Appointment Scheduled Provider:  Tillman Abide [facility])

## 2012-05-19 NOTE — Assessment & Plan Note (Signed)
No clear evidence of lower respiratory involvement Seems like bacterial sinus infection Will go right to augmentin for expanded coverage

## 2012-05-29 ENCOUNTER — Other Ambulatory Visit: Payer: Self-pay | Admitting: Internal Medicine

## 2012-06-11 ENCOUNTER — Encounter: Payer: Self-pay | Admitting: Internal Medicine

## 2012-06-14 ENCOUNTER — Encounter: Payer: Self-pay | Admitting: Internal Medicine

## 2012-06-15 ENCOUNTER — Telehealth: Payer: Self-pay | Admitting: Internal Medicine

## 2012-06-15 ENCOUNTER — Other Ambulatory Visit: Payer: Self-pay | Admitting: *Deleted

## 2012-06-15 MED ORDER — DOXYCYCLINE HYCLATE 100 MG PO TABS: 100.0000 mg | ORAL_TABLET | Freq: Two times a day (BID) | ORAL | Status: DC

## 2012-06-15 NOTE — Telephone Encounter (Signed)
Rx sent to pharmacy   

## 2012-06-15 NOTE — Telephone Encounter (Signed)
He is worse Go ahead and send new Rx for doxycycline 100mg  bid #20 x 0  I would have used levaquin but he has a history of a rash with cipro

## 2012-06-19 ENCOUNTER — Encounter: Payer: Self-pay | Admitting: Internal Medicine

## 2012-06-21 ENCOUNTER — Encounter: Payer: Self-pay | Admitting: Internal Medicine

## 2012-06-22 MED ORDER — BUDESONIDE 180 MCG/ACT IN AEPB: 2.0000 | INHALATION_SPRAY | Freq: Every day | RESPIRATORY_TRACT | Status: DC

## 2012-07-04 ENCOUNTER — Other Ambulatory Visit: Payer: Self-pay | Admitting: Internal Medicine

## 2012-07-05 NOTE — Telephone Encounter (Signed)
Okay #90 x 0 

## 2012-07-05 NOTE — Telephone Encounter (Signed)
rx sent to pharmacy by e-script  

## 2012-08-07 ENCOUNTER — Other Ambulatory Visit: Payer: Self-pay | Admitting: Internal Medicine

## 2012-08-08 NOTE — Telephone Encounter (Signed)
Okay #90 x 0 

## 2012-09-08 ENCOUNTER — Other Ambulatory Visit: Payer: Self-pay | Admitting: Internal Medicine

## 2012-09-08 NOTE — Telephone Encounter (Signed)
rx called into pharmacy

## 2012-09-08 NOTE — Telephone Encounter (Signed)
Okay #90 x 0 

## 2012-10-09 ENCOUNTER — Telehealth: Payer: Self-pay

## 2012-10-09 ENCOUNTER — Ambulatory Visit (INDEPENDENT_AMBULATORY_CARE_PROVIDER_SITE_OTHER): Payer: 59 | Admitting: Internal Medicine

## 2012-10-09 ENCOUNTER — Encounter: Payer: Self-pay | Admitting: Internal Medicine

## 2012-10-09 VITALS — BP 140/80 | HR 68 | Temp 98.6°F | Wt 264.0 lb

## 2012-10-09 DIAGNOSIS — Z1211 Encounter for screening for malignant neoplasm of colon: Secondary | ICD-10-CM

## 2012-10-09 DIAGNOSIS — R58 Hemorrhage, not elsewhere classified: Secondary | ICD-10-CM | POA: Insufficient documentation

## 2012-10-09 NOTE — Telephone Encounter (Signed)
Pt's wife left v/m requesting appt for colonoscopy; pt has never had colonoscopy before; after BM last night pt had a lot of bright red blood. No bleeding this morning. pts wife scheduled appt today with Dr Alphonsus Sias at 12:15 pm.

## 2012-10-09 NOTE — Progress Notes (Signed)
Subjective:    Patient ID: Lee Valenzuela, male    DOB: 07/29/1960, 52 y.o.   MRN: [id number]  HPI Noted some blood clots on the toilet paper last night Slight pink spot on paper this morning None in toilet bowl  No change in bowels No known hemorrhoids  Current Outpatient Prescriptions on File Prior to Visit  Medication Sig Dispense Refill  . albuterol (PROVENTIL HFA;VENTOLIN HFA) 108 (90 BASE) MCG/ACT inhaler Inhale 2 puffs into the lungs every 6 (six) hours as needed for wheezing.  1 Inhaler  3  . albuterol (PROVENTIL) (2.5 MG/3ML) 0.083% nebulizer solution Take 3 mLs (2.5 mg total) by nebulization 3 (three) times daily as needed for wheezing.  300 mL  0  . ALPRAZolam (XANAX) 0.25 MG tablet Take 0.25 mg by mouth 2 (two) times daily as needed.      . budesonide (PULMICORT FLEXHALER) 180 MCG/ACT inhaler Inhale 2 puffs into the lungs daily.  1 Inhaler  0  . ibuprofen (ADVIL,MOTRIN) 200 MG tablet Take 200 mg by mouth as needed. For pain       . pirbuterol (MAXAIR AUTOHALER) 200 MCG/INH inhaler Inhale 2 puffs into the lungs 3 (three) times daily as needed.  1 Inhaler  1  . traMADol (ULTRAM) 50 MG tablet TAKE 1 TABLET BY MOUTH 3 TIMES A DAY  90 tablet  0   No current facility-administered medications on file prior to visit.    Allergies  Allergen Reactions  . Ciprofloxacin     REACTION: ? Rash    Past Medical History  Diagnosis Date  . Asthma   . GERD (gastroesophageal reflux disease)   . Hyperlipidemia   . Lumbar disc disease   . Anxiety     with panic  . Pneumonia 1995  . Gastroenteritis 6/02    Past Surgical History  Procedure Laterality Date  . Laminectomy  1997  . Lumbar spine surgery  2007    x 2, fusion  . Knee arthroscopy  2/12    L knee  . Meniscus repair  2/12    [facility]    Family History  Problem Relation Age of Onset  . Depression Father     nervous breakdown  . Hypertension Father   . Ulcers Father   . Bipolar disorder Son   .  Schizophrenia Son   . Other Sister     nervous breakdown  . Allergies Other     In family  . Coronary artery disease Neg Hx   . Diabetes Neg Hx   . Ovarian cancer Paternal Grandmother   . Liver cancer Maternal Grandfather   . Migraines Neg Hx     History   Social History  . Marital Status: Married    Spouse Name: N/A    Number of Children: 3  . Years of Education: N/A   Occupational History  . 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     Social History Main Topics  . Smoking status: Never Smoker   . Smokeless tobacco: Never Used  . Alcohol Use: No  . Drug Use: Not on file  . Sexual Activity: Not on file   Other Topics Concern  . Not on file   Social History Narrative  . No narrative on file   Review of Systems No fever No dizziness Appetite is fine    Objective:   Physical Exam  Constitutional: He appears well-developed and well-nourished.  No distress.  Abdominal: Soft. Bowel sounds are normal. There is no tenderness. There is no rebound and no guarding.  Genitourinary:  No obvious fissures or hemorrhoids No internal lesions Greenish brown stool--heme negative          Assessment & Plan:

## 2012-10-09 NOTE — Assessment & Plan Note (Signed)
No obvious source but looks fine now Will set up for screening colonoscopy since he is due---to be sure no problems

## 2012-10-09 NOTE — Telephone Encounter (Signed)
Could have been diverticular or ?hemorrhoidal  Agree that appt needed to check before colonoscopy to assess severity, etc

## 2012-10-11 ENCOUNTER — Encounter: Payer: Self-pay | Admitting: Internal Medicine

## 2012-10-16 ENCOUNTER — Other Ambulatory Visit: Payer: Self-pay | Admitting: Internal Medicine

## 2012-10-17 MED ORDER — TRAMADOL HCL 50 MG PO TABS: 50.0000 mg | ORAL_TABLET | Freq: Three times a day (TID) | ORAL | Status: DC | PRN

## 2012-10-17 MED ORDER — BUDESONIDE 180 MCG/ACT IN AEPB: 2.0000 | INHALATION_SPRAY | Freq: Every day | RESPIRATORY_TRACT | Status: DC

## 2012-10-17 NOTE — Telephone Encounter (Signed)
Okay tramadol #90 x 0 pulmicort for 1 year

## 2012-10-17 NOTE — Telephone Encounter (Signed)
rx sent to pharmacy by e-script rx called into pharmacy  

## 2012-10-25 ENCOUNTER — Ambulatory Visit (AMBULATORY_SURGERY_CENTER): Payer: Self-pay | Admitting: *Deleted

## 2012-10-25 VITALS — Ht 75.0 in | Wt 263.6 lb

## 2012-10-25 DIAGNOSIS — Z1211 Encounter for screening for malignant neoplasm of colon: Secondary | ICD-10-CM

## 2012-10-25 MED ORDER — MOVIPREP 100 G PO SOLR: 1.0000 | Freq: Once | ORAL | Status: DC

## 2012-10-25 NOTE — Progress Notes (Signed)
NO EGG OR SOY ALLERGY. EWM NO PREVIOUS COLONOSCOPY. EWM USES CPAP, NO HOME 02 USE. EWM NO PROBLEMS WITH PAST SEDATION. EWM EMMI VIDEO TO E MAIL. EWM

## 2012-10-26 ENCOUNTER — Encounter: Payer: Self-pay | Admitting: Internal Medicine

## 2012-11-08 ENCOUNTER — Ambulatory Visit (AMBULATORY_SURGERY_CENTER): Payer: 59 | Admitting: Internal Medicine

## 2012-11-08 ENCOUNTER — Encounter: Payer: Self-pay | Admitting: Internal Medicine

## 2012-11-08 VITALS — BP 153/73 | HR 63 | Temp 97.0°F | Resp 13 | Ht 75.0 in | Wt 263.0 lb

## 2012-11-08 DIAGNOSIS — D126 Benign neoplasm of colon, unspecified: Secondary | ICD-10-CM

## 2012-11-08 DIAGNOSIS — Z1211 Encounter for screening for malignant neoplasm of colon: Secondary | ICD-10-CM

## 2012-11-08 MED ORDER — SODIUM CHLORIDE 0.9 % IV SOLN: 500.0000 mL | INTRAVENOUS | Status: DC

## 2012-11-08 NOTE — Patient Instructions (Signed)
YOU HAD AN ENDOSCOPIC PROCEDURE TODAY AT THE [facility]: Refer to the procedure report that was given to you for any specific questions about what was found during the examination.  If the procedure report does not answer your questions, please call your gastroenterologist to clarify.  If you requested that your care partner not be given the details of your procedure findings, then the procedure report has been included in a sealed envelope for you to review at your convenience later.  YOU SHOULD EXPECT: Some feelings of bloating in the abdomen. Passage of more gas than usual.  Walking can help get rid of the air that was put into your GI tract during the procedure and reduce the bloating. If you had a lower endoscopy (such as a colonoscopy or flexible sigmoidoscopy) you may notice spotting of blood in your stool or on the toilet paper. If you underwent a bowel prep for your procedure, then you may not have a normal bowel movement for a few days.  DIET: Your first meal following the procedure should be a light meal and then it is ok to progress to your normal diet.  A half-sandwich or bowl of soup is an example of a good first meal.  Heavy or fried foods are harder to digest and may make you feel nauseous or bloated.  Likewise meals heavy in dairy and vegetables can cause extra gas to form and this can also increase the bloating.  Drink plenty of fluids but you should avoid alcoholic beverages for 24 hours.  ACTIVITY: Your care partner should take you home directly after the procedure.  You should plan to take it easy, moving slowly for the rest of the day.  You can resume normal activity the day after the procedure however you should NOT DRIVE or use heavy machinery for 24 hours (because of the sedation medicines used during the test).    SYMPTOMS TO REPORT IMMEDIATELY: A gastroenterologist can be reached at any hour.  During normal business hours, 8:30 AM to 5:00 PM Monday through Friday,  call [phone].  After hours and on weekends, please call the GI answering service at [phone] who will take a message and have the physician on call contact you.   Following lower endoscopy (colonoscopy or flexible sigmoidoscopy):  Excessive amounts of blood in the stool  Significant tenderness or worsening of abdominal pains  Swelling of the abdomen that is new, acute  Fever of 100F or higher   FOLLOW UP: If any biopsies were taken you will be contacted by phone or by letter within the next 1-3 weeks.  Call your gastroenterologist if you have not heard about the biopsies in 3 weeks.  Our staff will call the home number listed on your records the next business day following your procedure to check on you and address any questions or concerns that you may have at that time regarding the information given to you following your procedure. This is a courtesy call and so if there is no answer at the home number and we have not heard from you through the emergency physician on call, we will assume that you have returned to your regular daily activities without incident.  SIGNATURES/CONFIDENTIALITY: You and/or your care partner have signed paperwork which will be entered into your electronic medical record.  These signatures attest to the fact that that the information above on your After Visit Summary has been reviewed and is understood.  Full responsibility of the confidentiality of   this discharge information lies with you and/or your care-partner.    Handout was given to your care partner on polyps. Await pathology results. Please resume your current medications today. Please call if any questions or concerns.

## 2012-11-08 NOTE — Progress Notes (Signed)
Procedure ends, to recovery, report given and VSS. 

## 2012-11-08 NOTE — Op Note (Signed)
[facility] [address] [place], [address]   COLONOSCOPY PROCEDURE REPORT  PATIENT: Lee Valenzuela, Lee Valenzuela  MR#: [id number] BIRTHDATE: 03/14/1960 , 52  yrs. old GENDER: Male ENDOSCOPIST: Beverley Fiedler, MD REFERRED [id number] Alphonsus Sias, M.D. PROCEDURE DATE:  11/08/2012 PROCEDURE:   Colonoscopy with snare polypectomy First Screening Colonoscopy - Avg.  risk and is 50 yrs.  old or older Yes.  Prior Negative Screening - Now for repeat screening. N/A  History of Adenoma - Now for follow-up colonoscopy & has been > or = to 3 yrs.  N/A  Polyps Removed Today? Yes. ASA CLASS:   Class II INDICATIONS:average risk screening and first colonoscopy. MEDICATIONS: MAC sedation, administered by CRNA and propofol (Diprivan) 300mg  IV  DESCRIPTION OF PROCEDURE:   After the risks benefits and alternatives of the procedure were thoroughly explained, informed consent was obtained.  A digital rectal exam revealed no rectal mass.   The LB [id number]  endoscope was introduced through the anus and advanced to the terminal ileum which was intubated for a short distance. No adverse events experienced.   The quality of the prep was good, using MoviPrep  The instrument was then slowly withdrawn as the colon was fully examined.   COLON FINDINGS: The mucosa appeared normal in the terminal ileum. Three sessile polyps ranging between 3-69mm in size were found in the transverse colon, descending colon, and distal sigmoid colon. Polypectomy was performed using cold snare.  All resections were complete and all polyp tissue was completely retrieved. Retroflexed views revealed no abnormalities. The time to cecum=2 minutes 44 seconds.  Withdrawal time=9 minutes 51 seconds.  The scope was withdrawn and the procedure completed.  COMPLICATIONS: There were no complications.  ENDOSCOPIC IMPRESSION: 1.   Normal mucosa in the terminal ileum 2.   Three sessile polyps ranging between 3-85mm in size were  found in the transverse colon, descending colon, and distal sigmoid colon; Polypectomy was performed using cold snare  RECOMMENDATIONS: 1.  Await pathology results 2.  Timing of repeat colonoscopy will be determined by pathology findings. 3.  You will receive a letter within 1-2 weeks with the results of your biopsy as well as final recommendations.  Please call my office if you have not received a letter after 3 weeks.   eSigned:  Beverley Fiedler, MD 11/08/2012 11:05 AM  cc: The Patient and Karie Schwalbe, MD

## 2012-11-08 NOTE — Progress Notes (Signed)
No complaints noted in the recovery room. Maw   

## 2012-11-08 NOTE — Progress Notes (Signed)
Called to room to assist during endoscopic procedure.  Patient ID and intended procedure confirmed with present staff. Received instructions for my participation in the procedure from the performing physician.Called to room to assist during endoscopic procedure.   

## 2012-11-09 ENCOUNTER — Telehealth: Payer: Self-pay | Admitting: *Deleted

## 2012-11-09 NOTE — Telephone Encounter (Signed)
  Follow up Call-  Call back number 11/08/2012  Post procedure Call Back phone  # [phone]  Permission to leave phone message Yes     Patient questions:  Do you have a fever, pain , or abdominal swelling? no Pain Score  0 *  Have you tolerated food without any problems? yes  Have you been able to return to your normal activities? yes  Do you have any questions about your discharge instructions: Diet   no Medications  no Follow up visit  no  Do you have questions or concerns about your Care? no  Actions: * If pain score is 4 or above: No action needed, pain <4.

## 2012-11-13 ENCOUNTER — Encounter: Payer: Self-pay | Admitting: Internal Medicine

## 2012-11-20 ENCOUNTER — Encounter: Payer: Self-pay | Admitting: Radiology

## 2012-11-21 ENCOUNTER — Encounter: Payer: Self-pay | Admitting: Internal Medicine

## 2012-11-21 ENCOUNTER — Ambulatory Visit (INDEPENDENT_AMBULATORY_CARE_PROVIDER_SITE_OTHER): Payer: 59 | Admitting: Internal Medicine

## 2012-11-21 VITALS — BP 122/70 | HR 67 | Temp 97.7°F | Wt 264.0 lb

## 2012-11-21 DIAGNOSIS — Z125 Encounter for screening for malignant neoplasm of prostate: Secondary | ICD-10-CM

## 2012-11-21 DIAGNOSIS — M5137 Other intervertebral disc degeneration, lumbosacral region: Secondary | ICD-10-CM

## 2012-11-21 DIAGNOSIS — Z0000 Encounter for general adult medical examination without abnormal findings: Secondary | ICD-10-CM

## 2012-11-21 DIAGNOSIS — E785 Hyperlipidemia, unspecified: Secondary | ICD-10-CM

## 2012-11-21 DIAGNOSIS — Z23 Encounter for immunization: Secondary | ICD-10-CM

## 2012-11-21 DIAGNOSIS — J45909 Unspecified asthma, uncomplicated: Secondary | ICD-10-CM

## 2012-11-21 DIAGNOSIS — F411 Generalized anxiety disorder: Secondary | ICD-10-CM

## 2012-11-21 LAB — CBC WITH DIFFERENTIAL/PLATELET
Basophils Absolute: 0 10*3/uL (ref 0.0–0.1)
Basophils Relative: 0.7 % (ref 0.0–3.0)
Eosinophils Relative: 2.6 % (ref 0.0–5.0)
HCT: 40.8 % (ref 39.0–52.0)
Hemoglobin: 13.8 g/dL (ref 13.0–17.0)
Lymphocytes Relative: 27.4 % (ref 12.0–46.0)
MCV: 84.8 fl (ref 78.0–100.0)
Monocytes Absolute: 0.5 10*3/uL (ref 0.1–1.0)
Neutro Abs: 3.8 10*3/uL (ref 1.4–7.7)
Neutrophils Relative %: 60.9 % (ref 43.0–77.0)
RBC: 4.81 Mil/uL (ref 4.22–5.81)
RDW: 13.9 % (ref 11.5–14.6)
WBC: 6.3 10*3/uL (ref 4.5–10.5)

## 2012-11-21 LAB — HEPATIC FUNCTION PANEL
ALT: 30 U/L (ref 0–53)
AST: 24 U/L (ref 0–37)
Albumin: 4.2 g/dL (ref 3.5–5.2)
Bilirubin, Direct: 0.1 mg/dL (ref 0.0–0.3)
Total Bilirubin: 0.7 mg/dL (ref 0.3–1.2)

## 2012-11-21 LAB — LIPID PANEL
Cholesterol: 224 mg/dL — ABNORMAL HIGH (ref 0–200)
HDL: 41.3 mg/dL (ref >39.00)
Total CHOL/HDL Ratio: 5
VLDL: 42.4 mg/dL — ABNORMAL HIGH (ref 0.0–40.0)

## 2012-11-21 LAB — BASIC METABOLIC PANEL
BUN: 12 mg/dL (ref 6–23)
Chloride: 104 mEq/L (ref 96–112)
Creatinine, Ser: 0.9 mg/dL (ref 0.4–1.5)
GFR: 95.85 mL/min (ref >60.00)
Potassium: 4.1 mEq/L (ref 3.5–5.1)

## 2012-11-21 LAB — LDL CHOLESTEROL, DIRECT: Direct LDL: 155.7 mg/dL

## 2012-11-21 NOTE — Assessment & Plan Note (Signed)
Better with less family stress Hasn't needed the alprazolam

## 2012-11-21 NOTE — Patient Instructions (Signed)
DASH Diet  The DASH diet stands for "Dietary Approaches to Stop Hypertension." It is a healthy eating plan that has been shown to reduce high blood pressure (hypertension) in as little as 14 days, while also possibly providing other significant health benefits. These other health benefits include reducing the risk of breast cancer after menopause and reducing the risk of type 2 diabetes, heart disease, colon cancer, and stroke. Health benefits also include weight loss and slowing kidney failure in patients with chronic kidney disease.   DIET GUIDELINES  · Limit salt (sodium). Your diet should contain less than 1500 mg of sodium daily.  · Limit refined or processed carbohydrates. Your diet should include mostly whole grains. Desserts and added sugars should be used sparingly.  · Include small amounts of heart-healthy fats. These types of fats include nuts, oils, and tub margarine. Limit saturated and trans fats. These fats have been shown to be harmful in the body.  CHOOSING FOODS   The following food groups are based on a 2000 calorie diet. See your Registered Dietitian for individual calorie needs.  Grains and Grain Products (6 to 8 servings daily)  · Eat More Often: Whole-wheat bread, brown rice, whole-grain or wheat pasta, quinoa, popcorn without added fat or salt (air popped).  · Eat Less Often: White bread, white pasta, white rice, cornbread.  Vegetables (4 to 5 servings daily)  · Eat More Often: Fresh, frozen, and canned vegetables. Vegetables may be raw, steamed, roasted, or grilled with a minimal amount of fat.  · Eat Less Often/Avoid: Creamed or fried vegetables. Vegetables in a cheese sauce.  Fruit (4 to 5 servings daily)  · Eat More Often: All fresh, canned (in natural juice), or frozen fruits. Dried fruits without added sugar. One hundred percent fruit juice (½ cup [237 mL] daily).  · Eat Less Often: Dried fruits with added sugar. Canned fruit in light or heavy syrup.  Lean Meats, Fish, and Poultry (2  servings or less daily. One serving is 3 to 4 oz [85-114 g]).  · Eat More Often: Ninety percent or leaner ground beef, tenderloin, sirloin. Round cuts of beef, chicken breast, turkey breast. All fish. Grill, bake, or broil your meat. Nothing should be fried.  · Eat Less Often/Avoid: Fatty cuts of meat, turkey, or chicken leg, thigh, or wing. Fried cuts of meat or fish.  Dairy (2 to 3 servings)  · Eat More Often: Low-fat or fat-free milk, low-fat plain or light yogurt, reduced-fat or part-skim cheese.  · Eat Less Often/Avoid: Milk (whole, 2%). Whole milk yogurt. Full-fat cheeses.  Nuts, Seeds, and Legumes (4 to 5 servings per week)  · Eat More Often: All without added salt.  · Eat Less Often/Avoid: Salted nuts and seeds, canned beans with added salt.  Fats and Sweets (limited)  · Eat More Often: Vegetable oils, tub margarines without trans fats, sugar-free gelatin. Mayonnaise and salad dressings.  · Eat Less Often/Avoid: Coconut oils, palm oils, butter, stick margarine, cream, half and half, cookies, candy, pie.  FOR MORE INFORMATION  The Dash Diet Eating Plan: www.dashdiet.org  Document Released: 12/31/2010 Document Revised: 04/05/2011 Document Reviewed: 12/31/2010  ExitCare® Patient Information ©2014 [employer].

## 2012-11-21 NOTE — Assessment & Plan Note (Signed)
Healthy but out of shape Discussed fitness and eating better Will check PSA after discussion

## 2012-11-21 NOTE — Progress Notes (Signed)
Subjective:    Patient ID: Lee Valenzuela, male    DOB: [birth year], 52 y.o.   MRN: [id number]  HPI Here for physical No new concerns  Asthma quiet No problems even in allergy season Regular with pulmicort  Chronic back problems Still uses the tramadol tid Hasn't been able to get off  Anxiety has been better Family issues have calmed down Hasn't needed the alprazolam  Current Outpatient Prescriptions on File Prior to Visit  Medication Sig Dispense Refill  . albuterol (PROVENTIL HFA;VENTOLIN HFA) 108 (90 BASE) MCG/ACT inhaler Inhale 2 puffs into the lungs every 6 (six) hours as needed for wheezing.  1 Inhaler  3  . albuterol (PROVENTIL) (2.5 MG/3ML) 0.083% nebulizer solution Take 3 mLs (2.5 mg total) by nebulization 3 (three) times daily as needed for wheezing.  300 mL  0  . ALPRAZolam (XANAX) 0.25 MG tablet Take 0.25 mg by mouth 2 (two) times daily as needed.      . budesonide (PULMICORT FLEXHALER) 180 MCG/ACT inhaler Inhale 2 puffs into the lungs daily.  1 Inhaler  11  . ibuprofen (ADVIL,MOTRIN) 200 MG tablet Take 200 mg by mouth as needed. For pain       . Multiple Vitamin (MULTIVITAMIN) capsule Take 1 capsule by mouth daily.      . traMADol (ULTRAM) 50 MG tablet Take 1 tablet (50 mg total) by mouth 3 (three) times daily as needed for pain.  90 tablet  0   No current facility-administered medications on file prior to visit.    Allergies  Allergen Reactions  . Ciprofloxacin     REACTION: ? Rash    Past Medical History  Diagnosis Date  . Asthma   . GERD (gastroesophageal reflux disease)   . Hyperlipidemia   . Lumbar disc disease   . Anxiety     with panic  . Pneumonia 1995  . Gastroenteritis 6/02  . Sleep apnea     USES CPAP    Past Surgical History  Procedure Laterality Date  . Laminectomy  1997  . Lumbar spine surgery  2007    x 2, fusion  . Knee arthroscopy  2/12    L knee  . Meniscus repair  2/12    [facility]  . Spinal fusion      Family  History  Problem Relation Age of Onset  . Depression Father     nervous breakdown  . Hypertension Father   . Ulcers Father   . Bipolar disorder Son   . Schizophrenia Son   . Other Sister     nervous breakdown  . Allergies Other     In family  . Coronary artery disease Neg Hx   . Diabetes Neg Hx   . Migraines Neg Hx   . Colon cancer Neg Hx   . Esophageal cancer Neg Hx   . Rectal cancer Neg Hx   . Stomach cancer Neg Hx   . Ovarian cancer Paternal Grandmother   . Liver cancer Maternal Grandfather     History   Social History  . Marital Status: Married    Spouse Name: N/A    Number of Children: 3  . Years of Education: N/A   Occupational History  . [employer] assistant, radio- supervisor         Social History Main Topics  . Smoking status: Never Smoker   . Smokeless tobacco: Never Used  . Alcohol Use: Yes     Comment: OCC BEER  . Drug Use:  Not on file  . Sexual Activity: Not on file   Other Topics Concern  . Not on file   Social History Narrative  . No narrative on file   Review of Systems  Constitutional: Negative for fatigue and unexpected weight change.       Wears seat belt  HENT: Positive for tinnitus. Negative for congestion, dental problem, hearing loss and rhinorrhea.        Hears "crickets" Regular with dentist  Eyes: Negative for visual disturbance.       No diplopia or unilateral vision loss  Respiratory: Positive for cough and wheezing. Negative for chest tightness and shortness of breath.        Rare cough or wheezing--uses inhaler rarely  Cardiovascular: Negative for chest pain, palpitations and leg swelling.  Gastrointestinal: Negative for nausea, vomiting, abdominal pain, constipation and blood in stool.       Uses famotidine at bedtime---controls the heartburn  Endocrine: Negative for cold intolerance and heat intolerance.  Genitourinary: Negative for urgency, frequency and difficulty urinating.       No sexual problems  Musculoskeletal: Positive  for arthralgias and back pain. Negative for joint swelling.       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       No suspicious lesions  Allergic/Immunologic: Negative for environmental allergies and immunocompromised state.  Neurological: Positive for numbness and headaches. Negative for dizziness, syncope, weakness and light-headedness.       Still some numb spots in his feet since the back surgery. Some burning also Occasional stress headache at work  Psychiatric/Behavioral: Negative for sleep disturbance and dysphoric mood. The patient is not nervous/anxious.        Sleeps with CPAP       Objective:   Physical Exam  Constitutional: He is oriented to person, place, and time. He appears well-developed and well-nourished. No distress.  HENT:  Head: Normocephalic and atraumatic.  Right Ear: External ear normal.  Left Ear: External ear normal.  Mouth/Throat: Oropharynx is clear and moist. No oropharyngeal exudate.  Eyes: Conjunctivae and EOM are normal. Pupils are equal, round, and reactive to light.  Neck: Normal range of motion. Neck supple. No thyromegaly present.  Cardiovascular: Normal rate, regular rhythm, normal heart sounds and intact distal pulses.  Exam reveals no gallop.   No murmur heard. Pulmonary/Chest: Effort normal and breath sounds normal. No respiratory distress. He has no wheezes. He has no rales.  Abdominal: Soft. There is no tenderness.  Musculoskeletal: He exhibits no edema and no tenderness.  Lymphadenopathy:    He has no cervical adenopathy.  Neurological: He is alert and oriented to person, place, and time.  Skin: No rash noted. No erythema.  Psychiatric: He has a normal mood and affect. His behavior is normal.          Assessment & Plan:

## 2012-11-21 NOTE — Assessment & Plan Note (Signed)
Doing well at this point Rare albuterol

## 2012-11-21 NOTE — Assessment & Plan Note (Signed)
Uses the tramadol regularly

## 2012-11-21 NOTE — Assessment & Plan Note (Signed)
Discussed primary prevention He wants to work on lifestyle first and not use meds for now

## 2012-11-22 ENCOUNTER — Other Ambulatory Visit: Payer: Self-pay | Admitting: Internal Medicine

## 2012-11-22 NOTE — Telephone Encounter (Signed)
Last filled 10/17/2012 

## 2012-11-23 NOTE — Telephone Encounter (Signed)
Okay #90 x 0 

## 2012-11-23 NOTE — Telephone Encounter (Signed)
rx called into pharmacy

## 2012-11-24 ENCOUNTER — Other Ambulatory Visit: Payer: Self-pay | Admitting: Internal Medicine

## 2012-11-24 NOTE — Telephone Encounter (Signed)
Spoke with pharmacist at [facility] and she could not find med phoned in.Medication phoned to pharmacy as instructed. Unable to notify pt due to no phone # left in v/m and no contact info in chart.

## 2012-11-24 NOTE — Telephone Encounter (Signed)
Spoke with [facility] and called med in from 11/22/12 approval.

## 2012-11-24 NOTE — Telephone Encounter (Signed)
Electronic refill request, please advise  

## 2012-12-11 ENCOUNTER — Encounter: Payer: Self-pay | Admitting: Internal Medicine

## 2012-12-25 ENCOUNTER — Other Ambulatory Visit: Payer: Self-pay | Admitting: Internal Medicine

## 2012-12-25 NOTE — Telephone Encounter (Signed)
Last filled 11/22/12

## 2012-12-25 NOTE — Telephone Encounter (Signed)
Okay #90 x 0 

## 2012-12-25 NOTE — Telephone Encounter (Signed)
rx called into pharmacy

## 2013-01-24 ENCOUNTER — Other Ambulatory Visit: Payer: Self-pay | Admitting: Internal Medicine

## 2013-01-24 NOTE — Telephone Encounter (Signed)
Okay #90 x 0 

## 2013-01-24 NOTE — Telephone Encounter (Signed)
Last filled 12/25/12

## 2013-01-24 NOTE — Telephone Encounter (Signed)
rx called into pharmacy

## 2013-02-26 ENCOUNTER — Other Ambulatory Visit: Payer: Self-pay | Admitting: Internal Medicine

## 2013-02-26 NOTE — Telephone Encounter (Signed)
Please call in

## 2013-02-26 NOTE — Telephone Encounter (Signed)
Lee Valenzuela PATIENT, Please send back to me for call in  

## 2013-02-27 NOTE — Telephone Encounter (Signed)
Rx called in to pharmacy. 

## 2013-02-28 ENCOUNTER — Other Ambulatory Visit: Payer: Self-pay | Admitting: Internal Medicine

## 2013-03-01 NOTE — Telephone Encounter (Signed)
LETVAK PATIENT, Please send back to me for call in  

## 2013-03-01 NOTE — Telephone Encounter (Signed)
rx called into pharmacy The 02/26/13 request as never phoned in

## 2013-03-01 NOTE — Telephone Encounter (Signed)
plz phone in - but plz check with pharmacy as it seems this was already sent in 02/26/2013

## 2013-03-30 ENCOUNTER — Other Ambulatory Visit: Payer: Self-pay | Admitting: Family Medicine

## 2013-03-30 NOTE — Telephone Encounter (Signed)
Ok to refill 

## 2013-03-31 NOTE — Telephone Encounter (Signed)
Okay #90 x 0 

## 2013-04-02 NOTE — Telephone Encounter (Signed)
rx called into pharmacy

## 2013-04-30 ENCOUNTER — Other Ambulatory Visit: Payer: Self-pay | Admitting: Internal Medicine

## 2013-04-30 NOTE — Telephone Encounter (Signed)
Okay #90 x 0 

## 2013-05-01 NOTE — Telephone Encounter (Signed)
rx called into pharmacy

## 2013-05-29 ENCOUNTER — Other Ambulatory Visit: Payer: Self-pay | Admitting: Internal Medicine

## 2013-05-30 NOTE — Telephone Encounter (Signed)
05/01/13 

## 2013-05-30 NOTE — Telephone Encounter (Signed)
Okay #90 x 0 

## 2013-05-30 NOTE — Telephone Encounter (Signed)
rx called into pharmacy

## 2013-06-26 ENCOUNTER — Other Ambulatory Visit: Payer: Self-pay | Admitting: Internal Medicine

## 2013-06-26 NOTE — Telephone Encounter (Signed)
Okay #90 x 0 

## 2013-06-26 NOTE — Telephone Encounter (Signed)
rx called into pharmacy

## 2013-07-30 ENCOUNTER — Other Ambulatory Visit: Payer: Self-pay | Admitting: Internal Medicine

## 2013-07-31 NOTE — Telephone Encounter (Signed)
rx called into pharmacy

## 2013-07-31 NOTE — Telephone Encounter (Signed)
06/26/13 

## 2013-07-31 NOTE — Telephone Encounter (Signed)
Okay #90 x 0 He is due for PE in November---have him set it up

## 2013-08-13 ENCOUNTER — Other Ambulatory Visit: Payer: Self-pay | Admitting: Internal Medicine

## 2013-09-07 ENCOUNTER — Other Ambulatory Visit: Payer: Self-pay | Admitting: Internal Medicine

## 2013-09-07 NOTE — Telephone Encounter (Signed)
07/31/13 

## 2013-09-07 NOTE — Telephone Encounter (Signed)
Okay #90 x 0 He is due for yearly exam anytime after November---he needs to make that appt

## 2013-09-07 NOTE — Telephone Encounter (Signed)
rx called into pharmacy

## 2013-10-09 ENCOUNTER — Other Ambulatory Visit: Payer: Self-pay | Admitting: Internal Medicine

## 2013-10-09 NOTE — Telephone Encounter (Signed)
09/07/13 

## 2013-10-10 NOTE — Telephone Encounter (Signed)
Okay #90 x 0 He needs to schedule his physical---should be before the end of the year

## 2013-10-10 NOTE — Telephone Encounter (Signed)
rx called into pharmacy

## 2013-11-13 ENCOUNTER — Other Ambulatory Visit: Payer: Self-pay | Admitting: Internal Medicine

## 2013-11-13 NOTE — Telephone Encounter (Signed)
10/10/13 

## 2013-11-14 NOTE — Telephone Encounter (Signed)
rx called into pharmacy

## 2013-11-14 NOTE — Telephone Encounter (Signed)
Okay #90 x 0 He is due for his physical---have him schedule in the next few months

## 2013-12-17 ENCOUNTER — Other Ambulatory Visit: Payer: Self-pay | Admitting: Internal Medicine

## 2013-12-17 NOTE — Telephone Encounter (Signed)
Approved: #90 x 0 

## 2013-12-17 NOTE — Telephone Encounter (Signed)
11/14/13 

## 2013-12-17 NOTE — Telephone Encounter (Signed)
Pre visit review using our clinic review tool, if applicable. No additional management support is needed unless otherwise documented below in the visit note. 

## 2014-01-01 ENCOUNTER — Other Ambulatory Visit: Payer: Self-pay | Admitting: Internal Medicine

## 2014-01-16 ENCOUNTER — Other Ambulatory Visit: Payer: Self-pay | Admitting: Internal Medicine

## 2014-01-16 NOTE — Telephone Encounter (Signed)
rx called into pharmacy

## 2014-01-16 NOTE — Telephone Encounter (Signed)
Approved: #90 x 0 

## 2014-01-16 NOTE — Telephone Encounter (Signed)
12/17/13 

## 2014-01-28 ENCOUNTER — Telehealth: Payer: Self-pay

## 2014-01-28 NOTE — Telephone Encounter (Signed)
Noted He does have appt for physical in April

## 2014-01-28 NOTE — Telephone Encounter (Signed)
PLEASE NOTE: All timestamps contained within this report are represented as [place] Standard Time. CONFIDENTIALTY NOTICE: This fax transmission is intended only for the addressee. It contains information that is legally privileged, confidential or otherwise protected from use or disclosure. If you are not the intended recipient, you are strictly prohibited from reviewing, disclosing, copying using or disseminating any of this information or taking any action in reliance on or regarding this information. If you have received this fax in error, please notify us immediately by telephone so that we can arrange for its return to [place]. Phone: [phone], Toll-Free: [phone], Fax: [phone] Page: 1 of 3 Call Id: [id number] [place] Patient Name: Lee Valenzuela Gender: Male DOB: 11/24/1960 Age: 54 Y 11 M 27 D Return Phone Number: [phone] (Primary) Address: [place]/State/Zip: [place] [address] Client [facility] Night - Client Client Site [place] Physician Viviana Simpler Contact Type Call Call Type Triage / Clinical Caller Name [place] Relationship To Patient Spouse Return Phone Number [phone] (Primary) Chief Complaint ASTHMA ATTACK Initial Comment Caller states husband needs rx for his asthma he is having asthma attacks PreDisposition Call Doctor Nurse Assessment Nurse: Julien Girt, RN, Almyra Free Date/Time Eilene Ghazi Time): 01/25/2014 2:35:56 PM Confirm and document reason for call. If symptomatic, describe symptoms. ---Caller states her husband needs refills on his asthma meds, he is completely out of his solution for his albuterol nebulizer. He also needs a refill on his Ventolin. States when he called the office on Monday he was told he could not have refills until he made an appt. He scheduled an appt in April,  has not been seen in the office in a year. Has the patient traveled out of the country within the last 30 days? ---Not Applicable Does the patient require triage? ---Declined Triage Please document clinical information provided and list any resource used. ---I advised the caller That I would contact the pharmacy and /or the on call provider and call her back. Verbalized understanding. Guidelines Guideline Title Affirmed Question Affirmed Notes Nurse Date/Time Eilene Ghazi Time) Medication Question Call [1] Request for URGENT new prescription or refill of "essential" medication (i.e., likelihood of harm to patient if not taken) AND [2] triager unable to fill per unit policy asthma meds Chancy Hurter 01/25/2014 3:48:10 PM Disp. Time Eilene Ghazi Time) Disposition Final User 01/25/2014 2:33:20 PM Send to Urgent Sharman Crate 01/25/2014 2:49:27 PM Paged On Call back to [facility], RNAlmyra Free 01/25/2014 3:48:38 PM Call PCP Now Yes Julien Girt, RN, Sheilah Mins NOTE: All timestamps contained within this report are represented as [place] Standard Time. CONFIDENTIALTY NOTICE: This fax transmission is intended only for the addressee. It contains information that is legally privileged, confidential or otherwise protected from use or disclosure. If you are not the intended recipient, you are strictly prohibited from reviewing, disclosing, copying using or disseminating any of this information or taking any action in reliance on or regarding this information. If you have received this fax in error, please notify us immediately by telephone so that we can arrange for its return to [place]. Phone: [phone], Toll-Free: [phone], Fax: [phone] Page: 2 of 3 Call Id: [id number] [place] Understands: Yes Disagree/Comply: Comply Care Advice Given Per Guideline CARE ADVICE given per Medication Question Call (Adult) guideline. CALL PCP NOW: You need to discuss this with your doctor. I'll page him  now. If you haven't  heard from the on-call doctor within 30 minutes, call again. After Care Instructions Given Call Event Type User Date / Time Description Verbal Orders/Maintenance Medications Medication Refill Route Dosage Regime Duration Admin Instructions User Name Albuterol Solution Yes Nebulisation .083% Albuterol Solution .083%, use as directed per nebulizer, disp 1 box, NR Adkins, RN, Almyra Free Ventolin Yes Inhaler Ventolin Inhaler, use as directed, disp 1, Maud Deed, RN, Almyra Free Comments User: Eloisa Northern, RN Date/Time (Eastern Time): 01/25/2014 2:53:19 PM Spoke to caller, she verifies that the pharmacist is who spoke to the office and told her husband he could not have refills until he ws seen by Dr. Kara Pacer. I expressed my concern about him waiting for an rx, she states he "can wait", denies any respiratory distress. Advised that I had pagd the on call and was awaiting cb. She verbalized understanding. User: Eloisa Northern, RN Date/Time Eilene Ghazi Time): 01/25/2014 3:41:14 PM Spoke to caller, advised that I was calling in her husband's rxs now. Verbalized understanding. User: Eloisa Northern, RN Date/Time (Eastern Time): 01/25/2014 3:47:43 PM (( PLEASE NOTE, I UPGRADED TO PAGE ON CALL DUE TO THE FACT THAT THE PT WAS DENIED REFILLS , NEEDED TO MAKE AN APPT,, AND DOES NOT REMEMBER WHEN IN THE OFFICE FOR ASTHMA( Paging DoctorName DoctorPhone DateTime Result/Outcome Notes Crissie Sickles [phone] 01/25/2014 2:49:27 PM Paged On Call Back to Call [place] 01/25/2014 3:19:21 PM Spoke with On Call - General Spoke to Dr. Anitra Lauth, information provided. States okay to call in reills for this pt. PLEASE NOTE: All timestamps contained within this report are represented as [place] Standard Time. CONFIDENTIALTY NOTICE: This fax transmission is intended only for the addressee. It contains information that is legally privileged, confidential or otherwise protected from use or disclosure.  If you are not the intended recipient, you are strictly prohibited from reviewing, disclosing, copying using or disseminating any of this information or taking any action in reliance on or regarding this information. If you have received this fax in error, please notify us immediately by telephone so that we can arrange for its return to [place]. Phone: [phone], Toll-Free: [phone], Fax: [phone] Page: 3 of 3 Call Id: [phone] [facility] [address], [place] [address] [phone] Fax: [phone] [place] Night - Client [place] - Night Date: 01/25/2014 From: QI Department To: Viviana Simpler Please sign the order for the approved drug(s) given by our call center nurse on your behalf. Fax to [phone] within 5 business days. Thank you. Date Eilene Ghazi Time): 01/25/2014 2:29:15 PM Triage RN: Eloisa Northern, RN NAME: Tyson Dense PHONE NUMBER: [phone] (Primary) BIRTHDATE: 1960-09-09 ADDRESS: [place] CITY/STATE/ZIP: [place] [address] CALLER: Spouse NAME: Lee Valenzuela Rx Given Medication Refill Route Dosage Regime Duration Admin Instructions Albuterol Solution Yes Nebulisation .083% Albuterol Solution as directed per nebulizer, 1 box, NR Ventolin Yes Inhaler Ventolin Inhaler, use directed, disp 1, NR MD Signature Date

## 2014-02-12 ENCOUNTER — Other Ambulatory Visit: Payer: Self-pay | Admitting: Internal Medicine

## 2014-02-18 ENCOUNTER — Other Ambulatory Visit: Payer: Self-pay | Admitting: Internal Medicine

## 2014-02-19 NOTE — Telephone Encounter (Signed)
01/16/14 

## 2014-02-19 NOTE — Telephone Encounter (Signed)
Approved: #90 x 0 

## 2014-02-19 NOTE — Telephone Encounter (Signed)
rx called into pharmacy

## 2014-03-22 ENCOUNTER — Other Ambulatory Visit: Payer: Self-pay | Admitting: Internal Medicine

## 2014-03-22 NOTE — Telephone Encounter (Signed)
Tramadol refill request.  Last filled 02/19/2014.  Last seen 11/21/2012.  Has appointment scheduled for April.  Please advise.

## 2014-03-22 NOTE — Telephone Encounter (Signed)
Approved: #90 x 0 

## 2014-03-22 NOTE — Telephone Encounter (Signed)
Tramadol called into [facility] per Dr Silvio Pate approval.

## 2014-04-14 ENCOUNTER — Other Ambulatory Visit: Payer: Self-pay | Admitting: Internal Medicine

## 2014-04-22 ENCOUNTER — Other Ambulatory Visit: Payer: Self-pay | Admitting: Internal Medicine

## 2014-04-22 NOTE — Telephone Encounter (Signed)
rx called into pharmacy

## 2014-04-22 NOTE — Telephone Encounter (Signed)
Last office visit 11/21/2012.  Last refilled 03/22/2014 for #90 with no refills.  CPE scheduled 05/06/2014.  Ok to refill?

## 2014-04-22 NOTE — Telephone Encounter (Signed)
Approved: #90 x 0 

## 2014-05-06 ENCOUNTER — Encounter: Payer: 59 | Admitting: Internal Medicine

## 2014-05-22 ENCOUNTER — Other Ambulatory Visit: Payer: Self-pay | Admitting: Internal Medicine

## 2014-05-22 NOTE — Telephone Encounter (Signed)
04/22/2014 

## 2014-05-22 NOTE — Telephone Encounter (Signed)
Approved: #90 x 0 

## 2014-05-22 NOTE — Telephone Encounter (Signed)
rx called into pharmacy

## 2014-06-19 ENCOUNTER — Other Ambulatory Visit: Payer: Self-pay | Admitting: Internal Medicine

## 2014-06-19 NOTE — Telephone Encounter (Signed)
05/22/2014 

## 2014-06-19 NOTE — Telephone Encounter (Signed)
Approved: #90 x 0 

## 2014-06-19 NOTE — Telephone Encounter (Signed)
rx called into pharmacy

## 2014-07-22 ENCOUNTER — Other Ambulatory Visit: Payer: Self-pay | Admitting: Internal Medicine

## 2014-07-22 ENCOUNTER — Telehealth: Payer: Self-pay

## 2014-07-22 NOTE — Telephone Encounter (Signed)
Pt left v/m requesting refill tramadol (last refilled # 90 on 06/19/2014) and refill maxair inhaler (last refilled 11/05/2011). Pt has appt [facility] on 11/25/14 but last time seen 02/22/2012.Please advise. Pt request cb.

## 2014-07-22 NOTE — Telephone Encounter (Signed)
Refill denied last seen 2014, needs appt

## 2014-07-23 MED ORDER — PIRBUTEROL ACETATE 200 MCG/INH IN AERB: 2.0000 | INHALATION_SPRAY | Freq: Three times a day (TID) | RESPIRATORY_TRACT | Status: DC | PRN

## 2014-07-23 MED ORDER — TRAMADOL HCL 50 MG PO TABS: 50.0000 mg | ORAL_TABLET | Freq: Three times a day (TID) | ORAL | Status: DC

## 2014-07-23 MED ORDER — ALBUTEROL SULFATE HFA 108 (90 BASE) MCG/ACT IN AERS: 2.0000 | INHALATION_SPRAY | Freq: Four times a day (QID) | RESPIRATORY_TRACT | Status: DC | PRN

## 2014-07-23 NOTE — Telephone Encounter (Signed)
Need to check what insurance will allow Send albuterol HFA inhaler instead I guess

## 2014-07-23 NOTE — Telephone Encounter (Addendum)
Joe with [facility] left v/m; does not have access to get Max air; Joe request substitute med to [place].Please advise.

## 2014-07-23 NOTE — Addendum Note (Signed)
Addended by: Despina Hidden on: 07/23/2014 12:49 PM   Modules accepted: Orders, Medications

## 2014-07-23 NOTE — Telephone Encounter (Signed)
Approved:okay maxair #1 x 0 Tramadol #90 x 0 Make sure he knows about the appt and will keep it (or I will never be able to refill a prescription again)

## 2014-07-23 NOTE — Telephone Encounter (Signed)
rx called into pharmacy Pt knows about appointment

## 2014-07-23 NOTE — Telephone Encounter (Signed)
rx sent to pharmacy by e-script  

## 2014-08-19 ENCOUNTER — Other Ambulatory Visit: Payer: Self-pay | Admitting: Internal Medicine

## 2014-08-19 NOTE — Telephone Encounter (Signed)
Due 07/23/2014

## 2014-08-23 ENCOUNTER — Other Ambulatory Visit: Payer: Self-pay | Admitting: Internal Medicine

## 2014-08-23 NOTE — Telephone Encounter (Signed)
Last filled 07/23/14 [facility] PATIENT, Please send back to me for call in

## 2014-08-23 NOTE — Telephone Encounter (Signed)
Ok to phone in tramadol 

## 2014-08-23 NOTE — Telephone Encounter (Signed)
rx called into pharmacy

## 2014-09-19 ENCOUNTER — Other Ambulatory Visit: Payer: Self-pay | Admitting: Internal Medicine

## 2014-09-19 NOTE — Telephone Encounter (Signed)
Approved: okay #90 x 0 

## 2014-09-19 NOTE — Telephone Encounter (Signed)
Last filled 08/23/14 #90--please advise if okay to refill

## 2014-09-20 NOTE — Telephone Encounter (Signed)
rx called into pharmacy

## 2014-10-17 ENCOUNTER — Other Ambulatory Visit: Payer: Self-pay | Admitting: Internal Medicine

## 2014-10-17 NOTE — Telephone Encounter (Signed)
09/20/2014 

## 2014-10-17 NOTE — Telephone Encounter (Signed)
rx called into pharmacy

## 2014-10-17 NOTE — Telephone Encounter (Signed)
Approved: #90 x 0 

## 2014-11-18 ENCOUNTER — Other Ambulatory Visit: Payer: Self-pay | Admitting: Internal Medicine

## 2014-11-18 NOTE — Telephone Encounter (Signed)
10/17/2014 

## 2014-11-19 NOTE — Telephone Encounter (Signed)
Approved: #90 x 0 

## 2014-11-19 NOTE — Telephone Encounter (Signed)
rx called into pharmacy

## 2014-11-25 ENCOUNTER — Encounter: Payer: Self-pay | Admitting: Internal Medicine

## 2014-11-25 ENCOUNTER — Ambulatory Visit (INDEPENDENT_AMBULATORY_CARE_PROVIDER_SITE_OTHER): Payer: 59 | Admitting: Internal Medicine

## 2014-11-25 VITALS — BP 148/100 | HR 61 | Temp 97.6°F | Ht 75.0 in | Wt 274.0 lb

## 2014-11-25 DIAGNOSIS — R030 Elevated blood-pressure reading, without diagnosis of hypertension: Secondary | ICD-10-CM | POA: Diagnosis not present

## 2014-11-25 DIAGNOSIS — J4530 Mild persistent asthma, uncomplicated: Secondary | ICD-10-CM

## 2014-11-25 DIAGNOSIS — M5137 Other intervertebral disc degeneration, lumbosacral region: Secondary | ICD-10-CM

## 2014-11-25 DIAGNOSIS — E785 Hyperlipidemia, unspecified: Secondary | ICD-10-CM

## 2014-11-25 DIAGNOSIS — Z0000 Encounter for general adult medical examination without abnormal findings: Secondary | ICD-10-CM

## 2014-11-25 DIAGNOSIS — K219 Gastro-esophageal reflux disease without esophagitis: Secondary | ICD-10-CM

## 2014-11-25 DIAGNOSIS — L570 Actinic keratosis: Secondary | ICD-10-CM | POA: Insufficient documentation

## 2014-11-25 DIAGNOSIS — Z23 Encounter for immunization: Secondary | ICD-10-CM | POA: Diagnosis not present

## 2014-11-25 LAB — CBC WITH DIFFERENTIAL/PLATELET
BASOS PCT: 0.4 % (ref 0.0–3.0)
Basophils Absolute: 0 10*3/uL (ref 0.0–0.1)
EOS ABS: 0.2 10*3/uL (ref 0.0–0.7)
EOS PCT: 2.1 % (ref 0.0–5.0)
HEMATOCRIT: 43 % (ref 39.0–52.0)
HEMOGLOBIN: 14.3 g/dL (ref 13.0–17.0)
LYMPHS PCT: 24.2 % (ref 12.0–46.0)
Lymphs Abs: 1.8 10*3/uL (ref 0.7–4.0)
MCHC: 33.2 g/dL (ref 30.0–36.0)
MCV: 86.1 fl (ref 78.0–100.0)
MONOS PCT: 5.5 % (ref 3.0–12.0)
Monocytes Absolute: 0.4 10*3/uL (ref 0.1–1.0)
NEUTROS ABS: 5.2 10*3/uL (ref 1.4–7.7)
Neutrophils Relative %: 67.8 % (ref 43.0–77.0)
PLATELETS: 227 10*3/uL (ref 150.0–400.0)
RBC: 5 Mil/uL (ref 4.22–5.81)
RDW: 13.4 % (ref 11.5–15.5)
WBC: 7.6 10*3/uL (ref 4.0–10.5)

## 2014-11-25 LAB — COMPREHENSIVE METABOLIC PANEL
ALBUMIN: 4.3 g/dL (ref 3.5–5.2)
ALT: 27 U/L (ref 0–53)
AST: 22 U/L (ref 0–37)
Alkaline Phosphatase: 53 U/L (ref 39–117)
BUN: 13 mg/dL (ref 6–23)
CALCIUM: 9.7 mg/dL (ref 8.4–10.5)
CHLORIDE: 103 meq/L (ref 96–112)
CO2: 27 meq/L (ref 19–32)
Creatinine, Ser: 0.93 mg/dL (ref 0.40–1.50)
GFR: 90.4 mL/min (ref >60.00)
Glucose, Bld: 95 mg/dL (ref 70–99)
POTASSIUM: 4.2 meq/L (ref 3.5–5.1)
Sodium: 140 mEq/L (ref 135–145)
Total Bilirubin: 0.6 mg/dL (ref 0.2–1.2)
Total Protein: 7.5 g/dL (ref 6.0–8.3)

## 2014-11-25 LAB — LIPID PANEL
CHOL/HDL RATIO: 4
CHOLESTEROL: 217 mg/dL — AB (ref 0–200)
HDL: 49.7 mg/dL (ref >39.00)
LDL CALC: 141 mg/dL — AB (ref 0–99)
NonHDL: 167.08
Triglycerides: 131 mg/dL (ref 0.0–149.0)
VLDL: 26.2 mg/dL (ref 0.0–40.0)

## 2014-11-25 LAB — T4, FREE: Free T4: 0.83 ng/dL (ref 0.60–1.60)

## 2014-11-25 MED ORDER — FLUTICASONE-SALMETEROL 250-50 MCG/DOSE IN AEPB: 1.0000 | INHALATION_SPRAY | Freq: Two times a day (BID) | RESPIRATORY_TRACT | Status: DC

## 2014-11-25 NOTE — Assessment & Plan Note (Signed)
Will change controller medication due to persistent symptoms

## 2014-11-25 NOTE — Assessment & Plan Note (Signed)
Healthy but really has let himself go Colon due 2019 Discussed PSA---will hold off again Flu vaccine

## 2014-11-25 NOTE — Assessment & Plan Note (Signed)
Will hold off on primary prevention

## 2014-11-25 NOTE — Progress Notes (Signed)
Subjective:    Patient ID: Lee Valenzuela, male    DOB: 10/20/1960, 54 y.o.   MRN: [id number]  HPI Here for physical  Had dizzy spells about 3 weeks ago May have had an infection--- was having spinning when his head moved Took 1 day off work--did have some nausea Has resolved No hearing loss. Mild tinnitus-- wonders if it could be the tramadol  Ongoing back pain Unable to wean the tramadol Has ongoing right foot nerve pain since his surgery Gets trho Will have spells of RLS--- relates to the surgery. Strange feeling that improves with leg movements  Asthma controlled Uses albuterol intermittently ---but takes it nearly every day Doesn't notice that the pulmicort is really helping and has been inconsistent with tihs Regular throat clearing--- not coughing. All day and awakens with this Reflux is controlled on the ranitidine Stays on the move--like yard work, but no set exercise  Current Outpatient Prescriptions on File Prior to Visit  Medication Sig Dispense Refill  . albuterol (PROVENTIL HFA;VENTOLIN HFA) 108 (90 BASE) MCG/ACT inhaler Inhale 2 puffs into the lungs every 6 (six) hours as needed for wheezing. 1 Inhaler 0  . budesonide (PULMICORT FLEXHALER) 180 MCG/ACT inhaler Inhale 2 puffs into the lungs daily. 1 Inhaler 11  . ibuprofen (ADVIL,MOTRIN) 200 MG tablet Take 200 mg by mouth as needed. For pain     . Multiple Vitamin (MULTIVITAMIN) capsule Take 1 capsule by mouth daily.    . traMADol (ULTRAM) 50 MG tablet TAKE 1 TABLET BY MOUTH 3 TIMES A DAY AS NEEDED 90 tablet 0   No current facility-administered medications on file prior to visit.    Allergies  Allergen Reactions  . Ciprofloxacin     REACTION: ? Rash    Past Medical History  Diagnosis Date  . Asthma   . GERD (gastroesophageal reflux disease)   . Hyperlipidemia   . Lumbar disc disease   . Anxiety     with panic  . Pneumonia 1995  . Gastroenteritis 6/02  . Sleep apnea     USES CPAP    Past  Surgical History  Procedure Laterality Date  . Laminectomy  1997  . Lumbar spine surgery  2007    x 2, fusion  . Knee arthroscopy  2/12    L knee  . Meniscus repair  2/12    [employer]  . Spinal fusion      Family History  Problem Relation Age of Onset  . Depression Father     nervous breakdown  . Hypertension Father   . Ulcers Father   . Bipolar disorder Son   . Schizophrenia Son   . Other Sister     nervous breakdown  . Allergies Other     In family  . Coronary artery disease Neg Hx   . Diabetes Neg Hx   . Migraines Neg Hx   . Colon cancer Neg Hx   . Esophageal cancer Neg Hx   . Rectal cancer Neg Hx   . Stomach cancer Neg Hx   . Ovarian cancer Paternal Grandmother   . Liver cancer Maternal Grandfather     Social History   Social History  . Marital Status: Married    Spouse Name: N/A  . Number of Children: 3  . Years of Education: N/A   Occupational History  . [employer]- supervisor         Social History Main Topics  . Smoking status: Never Smoker   .  Smokeless tobacco: Never Used  . Alcohol Use: Yes     Comment: OCC BEER  . Drug Use: Not on file  . Sexual Activity: Not on file   Other Topics Concern  . Not on file   Social History Narrative   Review of Systems  Constitutional: Positive for unexpected weight change. Negative for fatigue.       Up 10# Wears seat belt  HENT: Positive for tinnitus. Negative for dental problem and hearing loss.        Rare with dentist--never had cavity  Eyes: Positive for visual disturbance.       No diplopia or unilateral vision loss Has had "sparkles" and then loss of vision in certain vision fields. Wasn't unilateral Eye exam may have shown slight retinal changes  Respiratory: Positive for shortness of breath and wheezing. Negative for cough.   Cardiovascular: Negative for chest pain, palpitations and leg swelling.  Gastrointestinal: Negative for nausea, vomiting, abdominal pain, constipation and  blood in stool.  Endocrine: Negative for polydipsia and polyuria.  Genitourinary: Negative for urgency and difficulty urinating.       No sex--no problem  Musculoskeletal: Positive for back pain. Negative for joint swelling.       Some left shoulder pain--in bed  Skin: Negative for rash.       Multiple flat moles Has spot on tip of nose that bleeds at times and won't go away  Allergic/Immunologic: Negative for environmental allergies and immunocompromised state.  Neurological: Positive for headaches. Negative for dizziness, syncope, weakness and light-headedness.  Psychiatric/Behavioral: Negative for sleep disturbance and dysphoric mood. The patient is not nervous/anxious.        Objective:   Physical Exam  Constitutional: He is oriented to person, place, and time. He appears well-developed and well-nourished. No distress.  HENT:  Head: Normocephalic and atraumatic.  Right Ear: External ear normal.  Left Ear: External ear normal.  Mouth/Throat: Oropharynx is clear and moist. No oropharyngeal exudate.  Eyes: Conjunctivae are normal. Pupils are equal, round, and reactive to light.  Neck: Normal range of motion. Neck supple. No thyromegaly present.  Cardiovascular: Normal rate, regular rhythm, normal heart sounds and intact distal pulses.  Exam reveals no gallop.   No murmur heard. Pulmonary/Chest: Effort normal and breath sounds normal. No respiratory distress. He has no wheezes. He has no rales.  Abdominal: Soft. There is no tenderness.  Musculoskeletal: He exhibits no edema or tenderness.  Lymphadenopathy:    He has no cervical adenopathy.  Neurological: He is alert and oriented to person, place, and time.  Skin: No rash noted. No erythema.  2-59mm actinic tip of left nose  Psychiatric: He has a normal mood and affect. His behavior is normal.          Assessment & Plan:

## 2014-11-25 NOTE — Patient Instructions (Signed)
DASH Eating Plan DASH stands for "Dietary Approaches to Stop Hypertension." The DASH eating plan is a healthy eating plan that has been shown to reduce high blood pressure (hypertension). Additional health benefits may include reducing the risk of type 2 diabetes mellitus, heart disease, and stroke. The DASH eating plan may also help with weight loss. WHAT DO I NEED TO KNOW ABOUT THE DASH EATING PLAN? For the DASH eating plan, you will follow these general guidelines:  Choose foods with a percent daily value for sodium of less than 5% (as listed on the food label).  Use salt-free seasonings or herbs instead of table salt or sea salt.  Check with your health care provider or pharmacist before using salt substitutes.  Eat lower-sodium products, often labeled as "lower sodium" or "no salt added."  Eat fresh foods.  Eat more vegetables, fruits, and low-fat dairy products.  Choose whole grains. Look for the word "whole" as the first word in the ingredient list.  Choose fish and skinless chicken or turkey more often than red meat. Limit fish, poultry, and meat to 6 oz (170 g) each day.  Limit sweets, desserts, sugars, and sugary drinks.  Choose heart-healthy fats.  Limit cheese to 1 oz (28 g) per day.  Eat more home-cooked food and less restaurant, buffet, and fast food.  Limit fried foods.  Cook foods using methods other than frying.  Limit canned vegetables. If you do use them, rinse them well to decrease the sodium.  When eating at a restaurant, ask that your food be prepared with less salt, or no salt if possible. WHAT FOODS CAN I EAT? Seek help from a dietitian for individual calorie needs. Grains Whole grain or whole wheat bread. Brown rice. Whole grain or whole wheat pasta. Quinoa, bulgur, and whole grain cereals. Low-sodium cereals. Corn or whole wheat flour tortillas. Whole grain cornbread. Whole grain crackers. Low-sodium crackers. Vegetables Fresh or frozen vegetables  (raw, steamed, roasted, or grilled). Low-sodium or reduced-sodium tomato and vegetable juices. Low-sodium or reduced-sodium tomato sauce and paste. Low-sodium or reduced-sodium canned vegetables.  Fruits All fresh, canned (in natural juice), or frozen fruits. Meat and Other Protein Products Ground beef (85% or leaner), grass-fed beef, or beef trimmed of fat. Skinless chicken or turkey. Ground chicken or turkey. Pork trimmed of fat. All fish and seafood. Eggs. Dried beans, peas, or lentils. Unsalted nuts and seeds. Unsalted canned beans. Dairy Low-fat dairy products, such as skim or 1% milk, 2% or reduced-fat cheeses, low-fat ricotta or cottage cheese, or plain low-fat yogurt. Low-sodium or reduced-sodium cheeses. Fats and Oils Tub margarines without trans fats. Light or reduced-fat mayonnaise and salad dressings (reduced sodium). Avocado. Safflower, olive, or canola oils. Natural peanut or almond butter. Other Unsalted popcorn and pretzels. The items listed above may not be a complete list of recommended foods or beverages. Contact your dietitian for more options. WHAT FOODS ARE NOT RECOMMENDED? Grains White bread. White pasta. White rice. Refined cornbread. Bagels and croissants. Crackers that contain trans fat. Vegetables Creamed or fried vegetables. Vegetables in a cheese sauce. Regular canned vegetables. Regular canned tomato sauce and paste. Regular tomato and vegetable juices. Fruits Dried fruits. Canned fruit in light or heavy syrup. Fruit juice. Meat and Other Protein Products Fatty cuts of meat. Ribs, chicken wings, bacon, sausage, bologna, salami, chitterlings, fatback, hot dogs, bratwurst, and packaged luncheon meats. Salted nuts and seeds. Canned beans with salt. Dairy Whole or 2% milk, cream, half-and-half, and cream cheese. Whole-fat or sweetened yogurt. Full-fat   cheeses or blue cheese. Nondairy creamers and whipped toppings. Processed cheese, cheese spreads, or cheese  curds. Condiments Onion and garlic salt, seasoned salt, table salt, and sea salt. Canned and packaged gravies. Worcestershire sauce. Tartar sauce. Barbecue sauce. Teriyaki sauce. Soy sauce, including reduced sodium. Steak sauce. Fish sauce. Oyster sauce. Cocktail sauce. Horseradish. Ketchup and mustard. Meat flavorings and tenderizers. Bouillon cubes. Hot sauce. Tabasco sauce. Marinades. Taco seasonings. Relishes. Fats and Oils Butter, stick margarine, lard, shortening, ghee, and bacon fat. Coconut, palm kernel, or palm oils. Regular salad dressings. Other Pickles and olives. Salted popcorn and pretzels. The items listed above may not be a complete list of foods and beverages to avoid. Contact your dietitian for more information. WHERE CAN I FIND MORE INFORMATION? National Heart, Lung, and Blood Institute: travelstabloid.com   This information is not intended to replace advice given to you by your health care provider. Make sure you discuss any questions you have with your health care provider.   Document Released: 12/31/2010 Document Revised: 02/01/2014 Document Reviewed: 11/15/2012 Elsevier Interactive Patient Education 2016 [employer].   Exercising to [employer] Exercising can help you to lose weight. In order to lose weight through exercise, you need to do vigorous-intensity exercise. You can tell that you are exercising with vigorous intensity if you are breathing very hard and fast and cannot hold a conversation while exercising. Moderate-intensity exercise helps to maintain your current weight. You can tell that you are exercising at a moderate level if you have a higher heart rate and faster breathing, but you are still able to hold a conversation. HOW OFTEN SHOULD I EXERCISE? Choose an activity that you enjoy and set realistic goals. Your health care provider can help you to make an activity plan that works for you. Exercise regularly as directed by  your health care provider. This may include:  Doing resistance training twice each week, such as:  Push-ups.  Sit-ups.  Lifting weights.  Using resistance bands.  Doing a given intensity of exercise for a given amount of time. Choose from these options:  150 minutes of moderate-intensity exercise every week.  75 minutes of vigorous-intensity exercise every week.  A mix of moderate-intensity and vigorous-intensity exercise every week. Children, pregnant women, people who are out of shape, people who are overweight, and older adults may need to consult a health care provider for individual recommendations. If you have any sort of medical condition, be sure to consult your health care provider before starting a new exercise program. WHAT ARE SOME ACTIVITIES THAT CAN HELP ME TO LOSE WEIGHT?   Walking at a rate of at least 4.5 miles an hour.  Jogging or running at a rate of 5 miles per hour.  Biking at a rate of at least 10 miles per hour.  Lap swimming.  Roller-skating or in-line skating.  Cross-country skiing.  Vigorous competitive sports, such as football, basketball, and soccer.  Jumping rope.  Aerobic dancing. HOW CAN I BE MORE ACTIVE IN MY DAY-TO-DAY ACTIVITIES?  Use the stairs instead of the elevator.  Take a walk during your lunch break.  If you drive, park your car farther away from work or school.  If you take public transportation, get off one stop early and walk the rest of the way.  Make all of your phone calls while standing up and walking around.  Get up, stretch, and walk around every 30 minutes throughout the day. WHAT GUIDELINES SHOULD I FOLLOW WHILE EXERCISING?  Do not exercise so  much that you hurt yourself, feel dizzy, or get very short of breath.  Consult your health care provider prior to starting a new exercise program.  Wear comfortable clothes and shoes with good support.  Drink plenty of water while you exercise to prevent dehydration  or heat stroke. Body water is lost during exercise and must be replaced.  Work out until you breathe faster and your heart beats faster.   This information is not intended to replace advice given to you by your health care provider. Make sure you discuss any questions you have with your health care provider.   Document Released: 02/13/2010 Document Revised: 02/01/2014 Document Reviewed: 06/14/2013 Elsevier Interactive Patient Education [employer].

## 2014-11-25 NOTE — Progress Notes (Signed)
Pre visit review using our clinic review tool, if applicable. No additional management support is needed unless otherwise documented below in the visit note. 

## 2014-11-25 NOTE — Assessment & Plan Note (Signed)
BP Readings from Last 3 Encounters:  11/25/14 148/100  11/21/12 122/70  11/08/12 153/73   Discussed Rx Will work on lifestyle and consider Rx in 3 months

## 2014-11-25 NOTE — Addendum Note (Signed)
Addended by: Despina Hidden on: 11/25/2014 12:57 PM   Modules accepted: Orders

## 2014-11-25 NOTE — Assessment & Plan Note (Signed)
Liquid nitrogen 45 seconds x 2 Tolerated well Discussed home care Derm if recurs

## 2014-11-25 NOTE — Assessment & Plan Note (Signed)
Still needs the tramadol regularly

## 2014-11-25 NOTE — Assessment & Plan Note (Signed)
Discussed the possibility that this could affect the asthma--not clear cut though Continue the ranitidine

## 2014-12-17 ENCOUNTER — Other Ambulatory Visit: Payer: Self-pay | Admitting: Internal Medicine

## 2014-12-18 NOTE — Telephone Encounter (Signed)
Last filled #90 11/19/14.  Okay to refill?

## 2014-12-20 ENCOUNTER — Other Ambulatory Visit: Payer: Self-pay | Admitting: Internal Medicine

## 2014-12-21 NOTE — Telephone Encounter (Signed)
Last office visit 11/25/2014.  Last refilled 11/19/2014 for #90 with no refills.  Ok to refill?

## 2014-12-21 NOTE — Telephone Encounter (Signed)
Approved: #90 x 0 

## 2014-12-23 ENCOUNTER — Other Ambulatory Visit: Payer: Self-pay | Admitting: *Deleted

## 2014-12-23 ENCOUNTER — Encounter: Payer: Self-pay | Admitting: Internal Medicine

## 2014-12-23 MED ORDER — TRAMADOL HCL 50 MG PO TABS: 50.0000 mg | ORAL_TABLET | Freq: Three times a day (TID) | ORAL | Status: DC | PRN

## 2014-12-23 NOTE — Telephone Encounter (Signed)
Rx called in as directed. Patient notified via [place].

## 2014-12-23 NOTE — Telephone Encounter (Signed)
Approved: #90 x 0 Let him know I am sorry he ran out but there were issues with the holiday. He can call 1-2 days early, and I try to adjust if a holiday or weekend is when he is due again

## 2015-01-22 ENCOUNTER — Other Ambulatory Visit: Payer: Self-pay | Admitting: Internal Medicine

## 2015-01-22 NOTE — Telephone Encounter (Signed)
rx called into pharmacy

## 2015-01-22 NOTE — Telephone Encounter (Signed)
Approved: #90 x 0 

## 2015-01-22 NOTE — Telephone Encounter (Signed)
12/23/2014 

## 2015-02-20 ENCOUNTER — Other Ambulatory Visit: Payer: Self-pay | Admitting: Internal Medicine

## 2015-02-20 NOTE — Telephone Encounter (Signed)
01/22/2015 

## 2015-02-21 NOTE — Telephone Encounter (Signed)
Approved: #90 x 0 

## 2015-02-21 NOTE — Telephone Encounter (Signed)
rx called into pharmacy

## 2015-02-26 ENCOUNTER — Ambulatory Visit: Payer: 59 | Admitting: Internal Medicine

## 2015-02-28 ENCOUNTER — Other Ambulatory Visit: Payer: Self-pay | Admitting: Oncology

## 2015-03-24 ENCOUNTER — Other Ambulatory Visit: Payer: Self-pay | Admitting: Internal Medicine

## 2015-03-24 NOTE — Telephone Encounter (Signed)
01/01/2016

## 2015-03-24 NOTE — Telephone Encounter (Signed)
Approved: #90 x 0 

## 2015-03-24 NOTE — Telephone Encounter (Signed)
rx called into pharmacy

## 2015-03-26 ENCOUNTER — Encounter: Payer: Self-pay | Admitting: Internal Medicine

## 2015-03-26 ENCOUNTER — Ambulatory Visit (INDEPENDENT_AMBULATORY_CARE_PROVIDER_SITE_OTHER): Payer: 59 | Admitting: Internal Medicine

## 2015-03-26 VITALS — BP 138/86 | HR 74 | Temp 98.0°F | Wt 268.0 lb

## 2015-03-26 DIAGNOSIS — R030 Elevated blood-pressure reading, without diagnosis of hypertension: Secondary | ICD-10-CM | POA: Diagnosis not present

## 2015-03-26 NOTE — Progress Notes (Signed)
Subjective:    Patient ID: Lee Valenzuela, male    DOB: [birth year], 55 y.o.   MRN: [id number]  HPI Here for follow up of elevated BP  Has been walking at lunch at work most days Trying to eat smarter He can tell his BP is better Headaches are gone  No chest pain No SOB No palpitations  Current Outpatient Prescriptions on File Prior to Visit  Medication Sig Dispense Refill  . albuterol (PROVENTIL HFA;VENTOLIN HFA) 108 (90 BASE) MCG/ACT inhaler Inhale 2 puffs into the lungs every 6 (six) hours as needed for wheezing. 1 Inhaler 0  . Fluticasone-Salmeterol (ADVAIR) 250-50 MCG/DOSE AEPB Inhale 1 puff into the lungs 2 (two) times daily. 60 each 11  . ibuprofen (ADVIL,MOTRIN) 200 MG tablet Take 200 mg by mouth as needed. For pain     . Multiple Vitamin (MULTIVITAMIN) capsule Take 1 capsule by mouth daily.    . ranitidine (ZANTAC) 150 MG tablet Take 150 mg by mouth at bedtime.    . traMADol (ULTRAM) 50 MG tablet TAKE 1 TABLET BY MOUTH 3 TIMES A DAY 90 tablet 0   No current facility-administered medications on file prior to visit.    Allergies  Allergen Reactions  . Ciprofloxacin     REACTION: ? Rash    Past Medical History  Diagnosis Date  . Asthma   . GERD (gastroesophageal reflux disease)   . Hyperlipidemia   . Lumbar disc disease   . Anxiety     with panic  . Pneumonia 1995  . Gastroenteritis 6/02  . Sleep apnea     USES CPAP    Past Surgical History  Procedure Laterality Date  . Laminectomy  1997  . Lumbar spine surgery  2007    x 2, fusion  . Knee arthroscopy  2/12    L knee  . Meniscus repair  2/12    [employer]  . Spinal fusion      Family History  Problem Relation Age of Onset  . Depression Father     nervous breakdown  . Hypertension Father   . Ulcers Father   . Bipolar disorder Son   . Schizophrenia Son   . Other Sister     nervous breakdown  . Allergies Other     In family  . Coronary artery disease Neg Hx   . Diabetes Neg Hx   .  Migraines Neg Hx   . Colon cancer Neg Hx   . Esophageal cancer Neg Hx   . Rectal cancer Neg Hx   . Stomach cancer Neg Hx   . Ovarian cancer Paternal Grandmother   . Liver cancer Maternal Grandfather     Social History   Social History  . Marital Status: Married    Spouse Name: N/A  . Number of Children: 3  . 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 Smoking status: Never Smoker   . Smokeless tobacco: Never Used  . Alcohol Use: Yes     Comment: OCC BEER  . Drug Use: Not on file  . Sexual Activity: Not on file   Other Topics Concern  . Not on file   Social History Narrative   Review of Systems  Asthma better with new med Much less need for the albuterol     Objective:   Physical Exam  Constitutional: He appears well-developed and well-nourished.  No distress.  Neck: Normal range of motion. Neck supple. No thyromegaly present.  Cardiovascular: Normal rate, regular rhythm and normal heart sounds.  Exam reveals no gallop.   No murmur heard. Pulmonary/Chest: Effort normal and breath sounds normal. No respiratory distress. He has no wheezes. He has no rales.  Musculoskeletal: He exhibits no edema.  Lymphadenopathy:    He has no cervical adenopathy.          Assessment & Plan:

## 2015-03-26 NOTE — Assessment & Plan Note (Signed)
BP Readings from Last 3 Encounters:  03/26/15 138/86  11/25/14 148/100  11/21/12 122/70   Better now with lifestyle measures He will continue No meds for now

## 2015-03-26 NOTE — Progress Notes (Signed)
Pre visit review using our clinic review tool, if applicable. No additional management support is needed unless otherwise documented below in the visit note. 

## 2015-04-21 ENCOUNTER — Other Ambulatory Visit: Payer: Self-pay | Admitting: Internal Medicine

## 2015-04-21 NOTE — Telephone Encounter (Signed)
Last refill #90/0 03-24-15 Last OV 03-26-15 Next OV 03-26-16

## 2015-04-21 NOTE — Telephone Encounter (Signed)
Approved: #90 x 0 

## 2015-04-21 NOTE — Telephone Encounter (Signed)
Left refill on voice mail at pharmacy  

## 2015-05-02 ENCOUNTER — Encounter: Payer: Self-pay | Admitting: Primary Care

## 2015-05-02 ENCOUNTER — Ambulatory Visit (INDEPENDENT_AMBULATORY_CARE_PROVIDER_SITE_OTHER): Payer: 59 | Admitting: Primary Care

## 2015-05-02 VITALS — BP 140/92 | HR 65 | Temp 98.1°F | Ht 75.0 in | Wt 270.4 lb

## 2015-05-02 DIAGNOSIS — J209 Acute bronchitis, unspecified: Secondary | ICD-10-CM

## 2015-05-02 MED ORDER — HYDROCODONE-HOMATROPINE 5-1.5 MG/5ML PO SYRP: 5.0000 mL | ORAL_SOLUTION | Freq: Every evening | ORAL | Status: DC | PRN

## 2015-05-02 MED ORDER — AZITHROMYCIN 250 MG PO TABS
ORAL_TABLET | ORAL | Status: DC
Start: 2015-05-02 — End: 2016-03-26

## 2015-05-02 NOTE — Progress Notes (Signed)
Pre visit review using our clinic review tool, if applicable. No additional management support is needed unless otherwise documented below in the visit note. 

## 2015-05-02 NOTE — Progress Notes (Signed)
Subjective:    Patient ID: Lee Valenzuela, male    DOB: February 25, 1960, 55 y.o.   MRN: [id number]  HPI  Mr. Kemmerling is a 55 year old male who presents today with a chief complaint of cough. He also reports low grade fevers, sore throat, chills, body aches. His symptoms have been present for the past 2 weeks. He's taken Robitussin, Day/Night cough medication, and using his inhaler with temporary improvement. His cough is productive with thick, green mucous. Overall he's experienced no improvement in symptoms. His wife has the same symptoms and was treated yesterday.   Review of Systems  Constitutional: Positive for chills and fatigue. Negative for fever.  HENT: Positive for congestion and sore throat. Negative for ear pain.   Respiratory: Positive for cough.   Musculoskeletal: Positive for myalgias.       Past Medical History  Diagnosis Date  . Asthma   . GERD (gastroesophageal reflux disease)   . Hyperlipidemia   . Lumbar disc disease   . Anxiety     with panic  . Pneumonia 1995  . Gastroenteritis 6/02  . Sleep apnea     USES CPAP    Social History   Social History  . Marital Status: Married    Spouse Name: N/A  . Number of Children: 3  . 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 Smoking status: Never Smoker   . Smokeless tobacco: Never Used  . Alcohol Use: Yes     Comment: OCC BEER  . Drug Use: Not on file  . Sexual Activity: Not on file   Other Topics Concern  . Not on file   Social History Narrative    Past Surgical History  Procedure Laterality Date  . Laminectomy  1997  . Lumbar spine surgery  2007    x 2, fusion  . Knee arthroscopy  2/12    L knee  . Meniscus repair  2/12    [employer]  . Spinal fusion      Family History  Problem Relation Age of Onset  . Depression Father     nervous breakdown  . Hypertension Father   . Ulcers Father   . Bipolar disorder  Son   . Schizophrenia Son   . Other Sister     nervous breakdown  . Allergies Other     In family  . Coronary artery disease Neg Hx   . Diabetes Neg Hx   . Migraines Neg Hx   . Colon cancer Neg Hx   . Esophageal cancer Neg Hx   . Rectal cancer Neg Hx   . Stomach cancer Neg Hx   . Ovarian cancer Paternal Grandmother   . Liver cancer Maternal Grandfather     Allergies  Allergen Reactions  . Ciprofloxacin     REACTION: ? Rash    Current Outpatient Prescriptions on File Prior to Visit  Medication Sig Dispense Refill  . albuterol (PROVENTIL HFA;VENTOLIN HFA) 108 (90 BASE) MCG/ACT inhaler Inhale 2 puffs into the lungs every 6 (six) hours as needed for wheezing. 1 Inhaler 0  . Fluticasone-Salmeterol (ADVAIR) 250-50 MCG/DOSE AEPB Inhale 1 puff into the lungs 2 (two) times daily. 60 each 11  . ibuprofen (ADVIL,MOTRIN) 200 MG tablet Take 200 mg by mouth as needed. For pain     . Multiple Vitamin (MULTIVITAMIN) capsule Take 1 capsule by mouth  daily.    . ranitidine (ZANTAC) 150 MG tablet Take 150 mg by mouth at bedtime.    . traMADol (ULTRAM) 50 MG tablet TAKE 1 TABLET BY MOUTH 3 TIMES A DAY 90 tablet 0  . VENTOLIN HFA 108 (90 Base) MCG/ACT inhaler INHALE 2 PUFFS BY MOUTH EVERY 6 HOURS AS NEEDED FOR WHEEZING 18 Inhaler 2   No current facility-administered medications on file prior to visit.    BP 140/92 mmHg  Pulse 65  Temp(Src) 98.1 F (36.7 C) (Oral)  Ht 6\' 3"  (1.905 m)  Wt 270 lb 6.4 oz (122.653 kg)  BMI 33.80 kg/m2  SpO2 98%    Objective:   Physical Exam  Constitutional: He appears well-nourished. He appears ill.  HENT:  Right Ear: Ear canal normal. Tympanic membrane is bulging. Tympanic membrane is not erythematous.  Left Ear: Ear canal normal. Tympanic membrane is bulging. Tympanic membrane is not erythematous.  Nose: No mucosal edema. Right sinus exhibits no maxillary sinus tenderness and no frontal sinus tenderness. Left sinus exhibits no maxillary sinus tenderness  and no frontal sinus tenderness.  Mouth/Throat: Posterior oropharyngeal erythema present. No oropharyngeal exudate or posterior oropharyngeal edema.  Eyes: Conjunctivae are normal.  Neck: Neck supple.  Cardiovascular: Normal rate and regular rhythm.   Pulmonary/Chest: Effort normal. He has wheezes in the right lower field. He has rhonchi in the right lower field.  Skin: Skin is warm and dry.          Assessment & Plan:  Acute Bronchitis:  Cough, fatigue, congestion, low grade fever x 2 weeks. Overall worse with green, thick mucous from nasal cavity.  Exam with cough present, lungs mostly clear with mild rhonchi and wheezing to RLL. Appears ill, not toxic. Will need to treat for presumed bacterial involvement. Start Zpak. Continue Robitussin for day cough, Hycodan PRN for night cough. Fluids, rest, return precautions provided.

## 2015-05-02 NOTE — Patient Instructions (Signed)
Start Azithromycin antibiotics. Take 2 tablets by mouth today, then 1 tablet daily for 4 additional days.  You may take the Hycodan cough suppressant at bedtime as needed for cough and rest. Caution this medication contains codeine and will make you feel drowsy.  Continue Robitussin for daytime cough.   Ensure you are staying hydrated with water.  Please call me if no improvement in 3-4 days.  It was a pleasure to see you today!  Acute Bronchitis Bronchitis is inflammation of the airways that extend from the windpipe into the lungs (bronchi). The inflammation often causes mucus to develop. This leads to a cough, which is the most common symptom of bronchitis.  In acute bronchitis, the condition usually develops suddenly and goes away over time, usually in a couple weeks. Smoking, allergies, and asthma can make bronchitis worse. Repeated episodes of bronchitis may cause further lung problems.  CAUSES Acute bronchitis is most often caused by the same virus that causes a cold. The virus can spread from person to person (contagious) through coughing, sneezing, and touching contaminated objects. SIGNS AND SYMPTOMS   Cough.   Fever.   Coughing up mucus.   Body aches.   Chest congestion.   Chills.   Shortness of breath.   Sore throat.  DIAGNOSIS  Acute bronchitis is usually diagnosed through a physical exam. Your health care provider will also ask you questions about your medical history. Tests, such as chest X-rays, are sometimes done to rule out other conditions.  TREATMENT  Acute bronchitis usually goes away in a couple weeks. Oftentimes, no medical treatment is necessary. Medicines are sometimes given for relief of fever or cough. Antibiotic medicines are usually not needed but may be prescribed in certain situations. In some cases, an inhaler may be recommended to help reduce shortness of breath and control the cough. A cool mist vaporizer may also be used to help thin  bronchial secretions and make it easier to clear the chest.  HOME CARE INSTRUCTIONS  Get plenty of rest.   Drink enough fluids to keep your urine clear or pale yellow (unless you have a medical condition that requires fluid restriction). Increasing fluids may help thin your respiratory secretions (sputum) and reduce chest congestion, and it will prevent dehydration.   Take medicines only as directed by your health care provider.  If you were prescribed an antibiotic medicine, finish it all even if you start to feel better.  Avoid smoking and secondhand smoke. Exposure to cigarette smoke or irritating chemicals will make bronchitis worse. If you are a smoker, consider using nicotine gum or skin patches to help control withdrawal symptoms. Quitting smoking will help your lungs heal faster.   Reduce the chances of another bout of acute bronchitis by washing your hands frequently, avoiding people with cold symptoms, and trying not to touch your hands to your mouth, nose, or eyes.   Keep all follow-up visits as directed by your health care provider.  SEEK MEDICAL CARE IF: Your symptoms do not improve after 1 week of treatment.  SEEK IMMEDIATE MEDICAL CARE IF:  You develop an increased fever or chills.   You have chest pain.   You have severe shortness of breath.  You have bloody sputum.   You develop dehydration.  You faint or repeatedly feel like you are going to pass out.  You develop repeated vomiting.  You develop a severe headache. MAKE SURE YOU:   Understand these instructions.  Will watch your condition.  Will get help  right away if you are not doing well or get worse.   This information is not intended to replace advice given to you by your health care provider. Make sure you discuss any questions you have with your health care provider.   Document Released: 02/19/2004 Document Revised: 02/01/2014 Document Reviewed: 07/04/2012 Elsevier Interactive Patient  Education [employer].

## 2015-05-13 ENCOUNTER — Other Ambulatory Visit: Payer: Self-pay | Admitting: Internal Medicine

## 2015-05-20 ENCOUNTER — Other Ambulatory Visit: Payer: Self-pay | Admitting: Internal Medicine

## 2015-05-20 NOTE — Telephone Encounter (Signed)
Approved: #90 x 0 

## 2015-05-20 NOTE — Telephone Encounter (Signed)
Last filled 04-22-15 Last OV 03-26-15 Next OV 03-26-16

## 2015-05-20 NOTE — Telephone Encounter (Signed)
Left refill on voice mail at pharmacy  

## 2015-05-23 ENCOUNTER — Other Ambulatory Visit: Payer: Self-pay | Admitting: Internal Medicine

## 2015-06-19 ENCOUNTER — Other Ambulatory Visit: Payer: Self-pay | Admitting: Internal Medicine

## 2015-06-19 NOTE — Telephone Encounter (Signed)
Left refill on voice mail at pharmacy  

## 2015-06-19 NOTE — Telephone Encounter (Signed)
Last filled 05-23-15 #90 Last OV 03-27-15 Next OV 03-26-15

## 2015-06-19 NOTE — Telephone Encounter (Signed)
Approved: #90 x 0 

## 2015-07-16 ENCOUNTER — Other Ambulatory Visit: Payer: Self-pay | Admitting: Internal Medicine

## 2015-07-16 NOTE — Telephone Encounter (Signed)
Approved: #90 x 0 

## 2015-07-16 NOTE — Telephone Encounter (Signed)
Last filled 06-19-15 #90 Last OV 03-27-15 Next OV 03-26-15

## 2015-07-16 NOTE — Telephone Encounter (Signed)
Left refill on voice mail at pharmacy  

## 2015-08-18 ENCOUNTER — Other Ambulatory Visit: Payer: Self-pay | Admitting: Internal Medicine

## 2015-08-18 NOTE — Telephone Encounter (Signed)
Px written for call in   

## 2015-08-18 NOTE — Telephone Encounter (Signed)
Rx called in as prescribed 

## 2015-08-18 NOTE — Telephone Encounter (Signed)
Received refill request electronically Last refill 07/16/15 #90 Last office visit 03/26/15

## 2015-09-19 ENCOUNTER — Other Ambulatory Visit: Payer: Self-pay | Admitting: Internal Medicine

## 2015-09-19 ENCOUNTER — Other Ambulatory Visit: Payer: Self-pay | Admitting: Family Medicine

## 2015-09-19 NOTE — Telephone Encounter (Signed)
Last filled 08-18-15 #90 Last OV 03-26-15 Next OV 03-26-16

## 2015-09-19 NOTE — Telephone Encounter (Signed)
Approved: #90 x 0 

## 2015-09-19 NOTE — Telephone Encounter (Signed)
Left refill on voice mail at pharmacy  

## 2015-10-20 ENCOUNTER — Other Ambulatory Visit: Payer: Self-pay | Admitting: Internal Medicine

## 2015-10-20 NOTE — Telephone Encounter (Signed)
Last Filled 09-19-15 #90 Last OV 03-26-15 Next OV 03-26-16 Forward to Dr Lorelei Pont in Dr Alla German absence

## 2015-10-20 NOTE — Telephone Encounter (Signed)
help

## 2015-10-20 NOTE — Telephone Encounter (Signed)
Ok, 90, 0 ref 

## 2015-10-20 NOTE — Telephone Encounter (Signed)
Tramadol called into [facility].

## 2015-11-19 ENCOUNTER — Other Ambulatory Visit: Payer: Self-pay | Admitting: Internal Medicine

## 2015-11-20 NOTE — Telephone Encounter (Signed)
Last filled 10-20-15 #90 Last OV 03-26-15 Next OV 03-26-16

## 2015-11-20 NOTE — Telephone Encounter (Signed)
Please call in.  Thanks.   

## 2015-11-20 NOTE — Telephone Encounter (Signed)
Medication phoned to pharmacy.  

## 2015-11-20 NOTE — Telephone Encounter (Signed)
Forwarding to Dr Damita Dunnings in Dr Alla German absence

## 2015-12-17 ENCOUNTER — Other Ambulatory Visit: Payer: Self-pay | Admitting: Family Medicine

## 2015-12-17 NOTE — Telephone Encounter (Signed)
Last filled 11-20-15 #90 Last OV 03-26-15 Next OV 04-13-16 Forward to Dr Damita Dunnings since Dr Silvio Pate has left for the day.

## 2015-12-17 NOTE — Telephone Encounter (Signed)
Please call in.  Thanks.   

## 2015-12-17 NOTE — Telephone Encounter (Signed)
Rx called in to requested pharmacy 

## 2016-01-19 ENCOUNTER — Other Ambulatory Visit: Payer: Self-pay | Admitting: Family Medicine

## 2016-01-20 NOTE — Telephone Encounter (Signed)
Electronic refill request. Last Filled:    90 tablet 0 12/17/2015  Last office visit:   05/02/15  Acute with Anda Kraft.  Please advise.

## 2016-01-20 NOTE — Telephone Encounter (Signed)
Left refill on voice mail at pharmacy  

## 2016-01-20 NOTE — Telephone Encounter (Signed)
Approved: #90 x 0 

## 2016-02-18 ENCOUNTER — Other Ambulatory Visit: Payer: Self-pay | Admitting: Internal Medicine

## 2016-02-18 NOTE — Telephone Encounter (Signed)
Last filled 01-20-16 #90 Last OV 03-26-15 Next OV 03-26-16

## 2016-02-18 NOTE — Telephone Encounter (Signed)
Approved: #90 x 0 

## 2016-02-18 NOTE — Telephone Encounter (Signed)
Left refill on voice mail at pharmacy  

## 2016-03-13 ENCOUNTER — Other Ambulatory Visit: Payer: Self-pay | Admitting: Internal Medicine

## 2016-03-18 ENCOUNTER — Other Ambulatory Visit: Payer: Self-pay | Admitting: Internal Medicine

## 2016-03-18 NOTE — Telephone Encounter (Signed)
Left refill on voice mail at pharmacy  

## 2016-03-18 NOTE — Telephone Encounter (Signed)
Last filled 02-20-16 #90 Last OV Acute 05-02-15 Next OV 03-26-16

## 2016-03-18 NOTE — Telephone Encounter (Signed)
Approved: #90 x 0 

## 2016-03-26 ENCOUNTER — Ambulatory Visit (INDEPENDENT_AMBULATORY_CARE_PROVIDER_SITE_OTHER): Payer: Managed Care, Other (non HMO) | Admitting: Internal Medicine

## 2016-03-26 ENCOUNTER — Encounter: Payer: Self-pay | Admitting: Internal Medicine

## 2016-03-26 VITALS — BP 136/88 | HR 70 | Temp 98.2°F | Ht 74.0 in | Wt 267.5 lb

## 2016-03-26 DIAGNOSIS — M5137 Other intervertebral disc degeneration, lumbosacral region: Secondary | ICD-10-CM | POA: Diagnosis not present

## 2016-03-26 DIAGNOSIS — J4530 Mild persistent asthma, uncomplicated: Secondary | ICD-10-CM | POA: Diagnosis not present

## 2016-03-26 DIAGNOSIS — E538 Deficiency of other specified B group vitamins: Secondary | ICD-10-CM

## 2016-03-26 DIAGNOSIS — Z0000 Encounter for general adult medical examination without abnormal findings: Secondary | ICD-10-CM | POA: Diagnosis not present

## 2016-03-26 DIAGNOSIS — K219 Gastro-esophageal reflux disease without esophagitis: Secondary | ICD-10-CM | POA: Diagnosis not present

## 2016-03-26 DIAGNOSIS — Z23 Encounter for immunization: Secondary | ICD-10-CM

## 2016-03-26 DIAGNOSIS — E785 Hyperlipidemia, unspecified: Secondary | ICD-10-CM

## 2016-03-26 LAB — COMPREHENSIVE METABOLIC PANEL
ALBUMIN: 4.6 g/dL (ref 3.5–5.2)
ALK PHOS: 44 U/L (ref 39–117)
ALT: 26 U/L (ref 0–53)
AST: 21 U/L (ref 0–37)
BUN: 13 mg/dL (ref 6–23)
CO2: 28 mEq/L (ref 19–32)
CREATININE: 1.05 mg/dL (ref 0.40–1.50)
Calcium: 9.4 mg/dL (ref 8.4–10.5)
Chloride: 102 mEq/L (ref 96–112)
GFR: 78.19 mL/min (ref >60.00)
GLUCOSE: 86 mg/dL (ref 70–99)
Potassium: 4 mEq/L (ref 3.5–5.1)
SODIUM: 138 meq/L (ref 135–145)
TOTAL PROTEIN: 7.6 g/dL (ref 6.0–8.3)
Total Bilirubin: 0.6 mg/dL (ref 0.2–1.2)

## 2016-03-26 LAB — LIPID PANEL
CHOLESTEROL: 194 mg/dL (ref 0–200)
HDL: 37.4 mg/dL — ABNORMAL LOW (ref >39.00)
LDL Cholesterol: 128 mg/dL — ABNORMAL HIGH (ref 0–99)
NonHDL: 156.97
TRIGLYCERIDES: 144 mg/dL (ref 0.0–149.0)
Total CHOL/HDL Ratio: 5
VLDL: 28.8 mg/dL (ref 0.0–40.0)

## 2016-03-26 LAB — CBC WITH DIFFERENTIAL/PLATELET
BASOS PCT: 0.8 % (ref 0.0–3.0)
Basophils Absolute: 0.1 10*3/uL (ref 0.0–0.1)
EOS ABS: 0.1 10*3/uL (ref 0.0–0.7)
EOS PCT: 1.6 % (ref 0.0–5.0)
HCT: 41.7 % (ref 39.0–52.0)
HEMOGLOBIN: 14.3 g/dL (ref 13.0–17.0)
LYMPHS ABS: 2.1 10*3/uL (ref 0.7–4.0)
Lymphocytes Relative: 30.8 % (ref 12.0–46.0)
MCHC: 34.2 g/dL (ref 30.0–36.0)
MCV: 84.8 fl (ref 78.0–100.0)
MONO ABS: 0.5 10*3/uL (ref 0.1–1.0)
Monocytes Relative: 6.9 % (ref 3.0–12.0)
NEUTROS PCT: 59.9 % (ref 43.0–77.0)
Neutro Abs: 4.1 10*3/uL (ref 1.4–7.7)
Platelets: 230 10*3/uL (ref 150.0–400.0)
RBC: 4.92 Mil/uL (ref 4.22–5.81)
RDW: 13.1 % (ref 11.5–15.5)
WBC: 6.9 10*3/uL (ref 4.0–10.5)

## 2016-03-26 NOTE — Progress Notes (Signed)
Subjective:    Patient ID: Lee Valenzuela, male    DOB: 1960/05/23, 56 y.o.   MRN: [id number]  HPI Here for physical  He has ongoing problems with RLS at night He just has to move them around--"strange feeling" Worse if he is leaning over keyboard all day Tried something OTC--no help Discussed trying iron  Chronic back pain--takes the tramadol tid daily Will sleep better at some times  Tries to walk some on lunch --30 minutes Discussed picking up exercise He has dropped weight in past months  Asthma fairly quiet Uses the proventil occasionally Hasn't been using the advair due to the expense No regular cough or wheezing  Current Outpatient Prescriptions on File Prior to Visit  Medication Sig Dispense Refill  . albuterol (PROVENTIL) (2.5 MG/3ML) 0.083% nebulizer solution USE 1 VIAL IN NEBULIZER AS DIRECTED 75 mL 3  . ibuprofen (ADVIL,MOTRIN) 200 MG tablet Take 200 mg by mouth as needed. For pain     . Multiple Vitamin (MULTIVITAMIN) capsule Take 1 capsule by mouth daily.    . ranitidine (ZANTAC) 150 MG tablet Take 150 mg by mouth at bedtime.    . traMADol (ULTRAM) 50 MG tablet TAKE 1 TABLET BY MOUTH 3 TIMES A DAY AS NEEDED FOR PAIN 90 tablet 0  . VENTOLIN HFA 108 (90 Base) MCG/ACT inhaler INHALE 2 PUFFS BY MOUTH EVERY 6 HOURS AS NEEDED FOR WHEEZING 18 Inhaler 2   No current facility-administered medications on file prior to visit.     Allergies  Allergen Reactions  . Ciprofloxacin     REACTION: ? Rash    Past Medical History:  Diagnosis Date  . Anxiety    with panic  . Asthma   . Gastroenteritis 6/02  . GERD (gastroesophageal reflux disease)   . Hyperlipidemia   . Lumbar disc disease   . Pneumonia 1995  . Sleep apnea    USES CPAP    Past Surgical History:  Procedure Laterality Date  . KNEE ARTHROSCOPY  2/12   L knee  . LAMINECTOMY  1997  . LUMBAR SPINE SURGERY  2007   x 2, fusion  . MENISCUS REPAIR  2/12   [employer]  . SPINAL FUSION       Family History  Problem Relation Age of Onset  . Ovarian cancer Paternal Grandmother   . Liver cancer Maternal Grandfather   . Depression Father     nervous breakdown  . Hypertension Father   . Ulcers Father   . Bipolar disorder Son   . Schizophrenia Son   . Other Sister     nervous breakdown  . Allergies Other     In family  . Coronary artery disease Neg Hx   . Diabetes Neg Hx   . Migraines Neg Hx   . Colon cancer Neg Hx   . Esophageal cancer Neg Hx   . Rectal cancer Neg Hx   . Stomach cancer Neg Hx     Social History   Social History  . Marital status: Married    Spouse name: N/A  . Number of children: 3  . 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 Smoking status: Never Smoker  . Smokeless tobacco: Never Used  . Alcohol use Yes     Comment: OCC BEER  . Drug use: Unknown  . Sexual activity: Not on file   Other  Topics Concern  . Not on file   Social History Narrative  . No narrative on file   Review of Systems  Constitutional: Negative for unexpected weight change.       Wears seat belt  HENT: Negative for dental problem, hearing loss, tinnitus and trouble swallowing.        Keeps up with dentist  Eyes: Negative for visual disturbance.       No diplopia or unilateral vision loss  Respiratory: Positive for chest tightness. Negative for cough, shortness of breath and wheezing.        Some heaviness--that is his asthma  Cardiovascular: Negative for chest pain, palpitations and leg swelling.  Gastrointestinal: Negative for abdominal pain, blood in stool, constipation and nausea.       Heartburn controlled with nightly zantac (or occasional ginger pill)  Endocrine: Negative for polydipsia and polyuria.  Genitourinary: Negative for difficulty urinating and urgency.       No sexual problems  Musculoskeletal: Positive for back pain. Negative for arthralgias and joint swelling.   Skin: Negative for rash.       No suspicious lesions  Allergic/Immunologic: Negative for environmental allergies and immunocompromised state.  Neurological: Negative for dizziness, syncope and light-headedness.  Hematological: Negative for adenopathy. Does not bruise/bleed easily.  Psychiatric/Behavioral: Positive for sleep disturbance. Negative for dysphoric mood. The patient is not nervous/anxious.        Sleeps nightly with CPAP       Objective:   Physical Exam  Constitutional: He is oriented to person, place, and time. He appears well-developed and well-nourished. No distress.  HENT:  Head: Normocephalic and atraumatic.  Right Ear: External ear normal.  Left Ear: External ear normal.  Mouth/Throat: Oropharynx is clear and moist. No oropharyngeal exudate.  Eyes: Conjunctivae are normal. Pupils are equal, round, and reactive to light.  Neck: Normal range of motion. Neck supple. No thyromegaly present.  Cardiovascular: Normal rate, regular rhythm, normal heart sounds and intact distal pulses.  Exam reveals no gallop.   No murmur heard. Pulmonary/Chest: Effort normal and breath sounds normal. No respiratory distress. He has no wheezes. He has no rales.  Abdominal: Soft. There is no tenderness.  Musculoskeletal: He exhibits no edema or tenderness.  Lymphadenopathy:    He has no cervical adenopathy.  Neurological: He is alert and oriented to person, place, and time.  Skin: No rash noted. No erythema.  Psychiatric: He has a normal mood and affect. His behavior is normal.          Assessment & Plan:

## 2016-03-26 NOTE — Assessment & Plan Note (Signed)
Okay with nightly ranitidine

## 2016-03-26 NOTE — Assessment & Plan Note (Signed)
No meds

## 2016-03-26 NOTE — Assessment & Plan Note (Signed)
Doing okay even though he is off the advair

## 2016-03-26 NOTE — Assessment & Plan Note (Signed)
Regular tramadol Will check U[place] CSRS shows only Rx from here

## 2016-03-26 NOTE — Assessment & Plan Note (Signed)
Healthy but needs work on fitness Colon due 2019 He wishes to defer PSA after discussion prevnar due to the asthma

## 2016-03-26 NOTE — Progress Notes (Signed)
Pre visit review using our clinic review tool, if applicable. No additional management support is needed unless otherwise documented below in the visit note. 

## 2016-03-26 NOTE — Patient Instructions (Signed)
DASH Eating Plan DASH stands for "Dietary Approaches to Stop Hypertension." The DASH eating plan is a healthy eating plan that has been shown to reduce high blood pressure (hypertension). It may also reduce your risk for type 2 diabetes, heart disease, and stroke. The DASH eating plan may also help with weight loss. What are tips for following this plan? General guidelines  Avoid eating more than 2,300 mg (milligrams) of salt (sodium) a day. If you have hypertension, you may need to reduce your sodium intake to 1,500 mg a day.  Limit alcohol intake to no more than 1 drink a day for nonpregnant women and 2 drinks a day for men. One drink equals 12 oz of beer, 5 oz of wine, or 1 oz of hard liquor.  Work with your health care provider to maintain a healthy body weight or to lose weight. Ask what an ideal weight is for you.  Get at least 30 minutes of exercise that causes your heart to beat faster (aerobic exercise) most days of the week. Activities may include walking, swimming, or biking.  Work with your health care provider or diet and nutrition specialist (dietitian) to adjust your eating plan to your individual calorie needs. Reading food labels  Check food labels for the amount of sodium per serving. Choose foods with less than 5 percent of the Daily Value of sodium. Generally, foods with less than 300 mg of sodium per serving fit into this eating plan.  To find whole grains, look for the word "whole" as the first word in the ingredient list. Shopping  Buy products labeled as "low-sodium" or "no salt added."  Buy fresh foods. Avoid canned foods and premade or frozen meals. Cooking  Avoid adding salt when cooking. Use salt-free seasonings or herbs instead of table salt or sea salt. Check with your health care provider or pharmacist before using salt substitutes.  Do not fry foods. Cook foods using healthy methods such as baking, boiling, grilling, and broiling instead.  Cook with  heart-healthy oils, such as olive, canola, soybean, or sunflower oil. Meal planning   Eat a balanced diet that includes: ? 5 or more servings of fruits and vegetables each day. At each meal, try to fill half of your plate with fruits and vegetables. ? Up to 6-8 servings of whole grains each day. ? Less than 6 oz of lean meat, poultry, or fish each day. A 3-oz serving of meat is about the same size as a deck of cards. One egg equals 1 oz. ? 2 servings of low-fat dairy each day. ? A serving of nuts, seeds, or beans 5 times each week. ? Heart-healthy fats. Healthy fats called Omega-3 fatty acids are found in foods such as flaxseeds and coldwater fish, like sardines, salmon, and mackerel.  Limit how much you eat of the following: ? Canned or prepackaged foods. ? Food that is high in trans fat, such as fried foods. ? Food that is high in saturated fat, such as fatty meat. ? Sweets, desserts, sugary drinks, and other foods with added sugar. ? Full-fat dairy products.  Do not salt foods before eating.  Try to eat at least 2 vegetarian meals each week.  Eat more home-cooked food and less restaurant, buffet, and fast food.  When eating at a restaurant, ask that your food be prepared with less salt or no salt, if possible. What foods are recommended? The items listed may not be a complete list. Talk with your dietitian about what   dietary choices are best for you. Grains Whole-grain or whole-wheat bread. Whole-grain or whole-wheat pasta. Brown rice. Oatmeal. Quinoa. Bulgur. Whole-grain and low-sodium cereals. Pita bread. Low-fat, low-sodium crackers. Whole-wheat flour tortillas. Vegetables Fresh or frozen vegetables (raw, steamed, roasted, or grilled). Low-sodium or reduced-sodium tomato and vegetable juice. Low-sodium or reduced-sodium tomato sauce and tomato paste. Low-sodium or reduced-sodium canned vegetables. Fruits All fresh, dried, or frozen fruit. Canned fruit in natural juice (without  added sugar). Meat and other protein foods Skinless chicken or turkey. Ground chicken or turkey. Pork with fat trimmed off. Fish and seafood. Egg whites. Dried beans, peas, or lentils. Unsalted nuts, nut butters, and seeds. Unsalted canned beans. Lean cuts of beef with fat trimmed off. Low-sodium, lean deli meat. Dairy Low-fat (1%) or fat-free (skim) milk. Fat-free, low-fat, or reduced-fat cheeses. Nonfat, low-sodium ricotta or cottage cheese. Low-fat or nonfat yogurt. Low-fat, low-sodium cheese. Fats and oils Soft margarine without trans fats. Vegetable oil. Low-fat, reduced-fat, or light mayonnaise and salad dressings (reduced-sodium). Canola, safflower, olive, soybean, and sunflower oils. Avocado. Seasoning and other foods Herbs. Spices. Seasoning mixes without salt. Unsalted popcorn and pretzels. Fat-free sweets. What foods are not recommended? The items listed may not be a complete list. Talk with your dietitian about what dietary choices are best for you. Grains Baked goods made with fat, such as croissants, muffins, or some breads. Dry pasta or rice meal packs. Vegetables Creamed or fried vegetables. Vegetables in a cheese sauce. Regular canned vegetables (not low-sodium or reduced-sodium). Regular canned tomato sauce and paste (not low-sodium or reduced-sodium). Regular tomato and vegetable juice (not low-sodium or reduced-sodium). Pickles. Olives. Fruits Canned fruit in a light or heavy syrup. Fried fruit. Fruit in cream or butter sauce. Meat and other protein foods Fatty cuts of meat. Ribs. Fried meat. Bacon. Sausage. Bologna and other processed lunch meats. Salami. Fatback. Hotdogs. Bratwurst. Salted nuts and seeds. Canned beans with added salt. Canned or smoked fish. Whole eggs or egg yolks. Chicken or turkey with skin. Dairy Whole or 2% milk, cream, and half-and-half. Whole or full-fat cream cheese. Whole-fat or sweetened yogurt. Full-fat cheese. Nondairy creamers. Whipped toppings.  Processed cheese and cheese spreads. Fats and oils Butter. Stick margarine. Lard. Shortening. Ghee. Bacon fat. Tropical oils, such as coconut, palm kernel, or palm oil. Seasoning and other foods Salted popcorn and pretzels. Onion salt, garlic salt, seasoned salt, table salt, and sea salt. Worcestershire sauce. Tartar sauce. Barbecue sauce. Teriyaki sauce. Soy sauce, including reduced-sodium. Steak sauce. Canned and packaged gravies. Fish sauce. Oyster sauce. Cocktail sauce. Horseradish that you find on the shelf. Ketchup. Mustard. Meat flavorings and tenderizers. Bouillon cubes. Hot sauce and Tabasco sauce. Premade or packaged marinades. Premade or packaged taco seasonings. Relishes. Regular salad dressings. Where to find more information:  National Heart, Lung, and Blood Institute: www.nhlbi.nih.gov  American Heart Association: www.heart.org Summary  The DASH eating plan is a healthy eating plan that has been shown to reduce high blood pressure (hypertension). It may also reduce your risk for type 2 diabetes, heart disease, and stroke.  With the DASH eating plan, you should limit salt (sodium) intake to 2,300 mg a day. If you have hypertension, you may need to reduce your sodium intake to 1,500 mg a day.  When on the DASH eating plan, aim to eat more fresh fruits and vegetables, whole grains, lean proteins, low-fat dairy, and heart-healthy fats.  Work with your health care provider or diet and nutrition specialist (dietitian) to adjust your eating plan to your individual   calorie needs. This information is not intended to replace advice given to you by your health care provider. Make sure you discuss any questions you have with your health care provider. Document Released: 12/31/2010 Document Revised: 01/05/2016 Document Reviewed: 01/05/2016 Elsevier Interactive Patient Education  2017 Elsevier Inc.  

## 2016-03-26 NOTE — Assessment & Plan Note (Signed)
On oral supplements 

## 2016-03-26 NOTE — Addendum Note (Signed)
Addended by: Modena Nunnery on: 03/26/2016 12:41 PM   Modules accepted: Orders

## 2016-04-01 LAB — TOXASSURE SELECT 13 (MW), URINE

## 2016-04-19 ENCOUNTER — Other Ambulatory Visit: Payer: Self-pay | Admitting: Internal Medicine

## 2016-04-19 NOTE — Telephone Encounter (Signed)
Left refill on voice mail at pharmacy  

## 2016-04-19 NOTE — Telephone Encounter (Signed)
Approved: #90 x 0 

## 2016-04-19 NOTE — Telephone Encounter (Signed)
Last filled 03-20-16 #90 Last OV 03-26-16 Next OV 03-30-17

## 2016-05-17 ENCOUNTER — Other Ambulatory Visit: Payer: Self-pay | Admitting: Internal Medicine

## 2016-05-18 NOTE — Telephone Encounter (Signed)
Approved: #90 x 0 

## 2016-05-18 NOTE — Telephone Encounter (Signed)
Left refill on voice mail at pharmacy  

## 2016-05-18 NOTE — Telephone Encounter (Signed)
Last filled 04-19-16 #90 Last OV 03-26-16 Next OV 03-30-17

## 2016-06-16 ENCOUNTER — Other Ambulatory Visit: Payer: Self-pay | Admitting: Internal Medicine

## 2016-06-17 NOTE — Telephone Encounter (Signed)
plz phone in. 

## 2016-06-17 NOTE — Telephone Encounter (Signed)
Rx called in to pharmacy. 

## 2016-06-17 NOTE — Telephone Encounter (Signed)
[place] pt, last filled 05/18/16... Please advise

## 2016-06-21 ENCOUNTER — Encounter: Payer: Self-pay | Admitting: Internal Medicine

## 2016-06-21 ENCOUNTER — Other Ambulatory Visit: Payer: Self-pay | Admitting: Family Medicine

## 2016-06-22 ENCOUNTER — Telehealth: Payer: Self-pay | Admitting: *Deleted

## 2016-06-22 NOTE — Telephone Encounter (Signed)
PLEASE NOTE: All timestamps contained within this report are represented as [place] Standard Time. CONFIDENTIALTY NOTICE: This fax transmission is intended only for the addressee. It contains information that is legally privileged, confidential or otherwise protected from use or disclosure. If you are not the intended recipient, you are strictly prohibited from reviewing, disclosing, copying using or disseminating any of this information or taking any action in reliance on or regarding this information. If you have received this fax in error, please notify us immediately by telephone so that we can arrange for its return to [place]. Phone: [phone], Toll-Free: [phone], Fax: [phone] Page: 1 of 2 Call Id: [id number] [place] Patient Name: Lee Valenzuela Gender: Male DOB: 1960/02/03 Age: 56 Y 50 M 23 D Return Phone Number: [phone] (Primary) City/State/Zip: [place] Client [place] [facility] - Client Client Site [facility] - Day Physician Viviana Simpler - MD Who Is Calling Patient / Member / Family / Caregiver Call Type Triage / Clinical Caller Name Jaydis Relationship To Patient Self Return Phone Number [phone] (Primary) Chief Complaint Back Injury Reason for Call Symptomatic / Request for [place] states he has had three back surgeries and takes pain medication. Pharmacy will not refill his medication. He is completely out of medication. He has been taking it for years and will not stop taking it cold [place]. Is having bad back pain. Appointment Disposition EMR Appointment Not Necessary Info pasted into Epic No Nurse Assessment Nurse: Erling Cruz, RN, Morey Hummingbird Date/Time (Eastern Time): 06/21/2016 12:27:12 PM Confirm and document reason for call. If symptomatic, describe symptoms. ---Caller states he has had three  back surgeries and takes pain medication. Pharmacy will not refill his medication. He is completely out of medication. He has been taking it for years and will not stop taking it cold [place]. Is having bad back pain. Nurse: Erling Cruz, RN, Morey Hummingbird Date/Time (Eastern Time): 06/21/2016 12:28:10 PM Please select the assessment type ---Refill Additional Documentation ---Caller states he has had three back surgeries and takes pain medication. Pharmacy will not refill his medication. He is completely out of medication. He has been taking it for years and will not stop taking it cold [place]. Is having bad back pain. Tramadol. Does the patient have enough medication to last until the office opens? ---Unable to obtain loaner dose from Pharmacy Does the client directives allow for assistance with medications after hours? ---Yes Was the medication filled within the last 6 months? ---Yes What is the name of the medication, dose and instructions as listed on the bottle? ---Tramadol 50mg  3 times a day Name of the physician as listed on the bottle. ---Dr. Viviana Simpler Pharmacy name and phone number where most recently filled. [facility] [phone] PLEASE NOTE: All timestamps contained within this report are represented as [place] Standard Time. CONFIDENTIALTY NOTICE: This fax transmission is intended only for the addressee. It contains information that is legally privileged, confidential or otherwise protected from use or disclosure. If you are not the intended recipient, you are strictly prohibited from reviewing, disclosing, copying using or disseminating any of this information or taking any action in reliance on or regarding this information. If you have received this fax in error, please notify us immediately by telephone so that we can arrange for its return to [place]. Phone: [phone], Toll-Free: [phone], Fax: [phone] Page: 2 of 2 Call Id: [id number] Guidelines Guideline Title Affirmed  Question  Disp. Time Eilene Ghazi Time) Disposition Final User 06/21/2016 12:58:57 PM Clinical Call Yes Love, RN, Morey Hummingbird Comments User: Germain Osgood, RN Date/Time Eilene Ghazi Time): 06/21/2016 12:57:45 PM Spoke with caller. Advised caller MD unable to call in medication as it is a controlled substance & he will need to call back during office hours to request refill. Caller very unhappy. Advised caller can try going to [facility] if he thinks unable to wait for office to be open. Caller verbalized understanding but states he will not be doing that. Paging DoctorName Phone DateTime Action Result/Outcome Notes Simonne Martinet - MD [phone] 06/21/2016 12:32:35 PM Doctor Paged Called On Call Provider - Left Message Simonne Martinet - MD [phone] 06/21/2016 12:54:16 PM Doctor Paged Called On Call Provider - Letta Kocher - MD 06/21/2016 12:54:53 PM Message Result Spoke with On Call - General Spoke with on-call MD. Hanley Seamen report. MD states unable to call in medication at this time; caller will need to contact office tomorrow.

## 2016-06-22 NOTE — Telephone Encounter (Signed)
PA Approved from 06-22-16 to 12-23-16. Spoke to pt. He is going to mark it on his calendar to contact us in late November to do a new PA so he does not have to go through this again.

## 2016-06-22 NOTE — Telephone Encounter (Signed)
Speaking to [place] at [place].

## 2016-06-22 NOTE — Telephone Encounter (Signed)
Patient left a voicemail stating that he has been trying to get a refill on his Tramadol since last week and now has run out of the medication. Patient stated that this is an ongoing problem and this is not acceptable. EMR shows that script was called in 06/17/16.  Called and spoke to [place] at pharmacy and was advised that they have the script on hold, but it shows that a PA needs to be done in order for patient to get a 90 day supply. Renee stated that they had faxed the PA information over to Dr. Silvio Pate, but it was sent to a [phone] number advised her that this is incorrect and gave her the correct number and she stated that she will fax the PA request over. Patient request a call back ASAP

## 2016-06-22 NOTE — Telephone Encounter (Signed)
Spoke to [place] at the pharmacy. She said it was just a regular PA for the medication. She said she spoke to the pt over the weekend and advised him of this. She is going to give me his ins info over the phone so I can take care of this.  [employer]: [phone] Member ID: [id number] [place]: [id number] GRP: [id number]

## 2016-07-19 ENCOUNTER — Other Ambulatory Visit: Payer: Self-pay | Admitting: Family Medicine

## 2016-07-19 NOTE — Telephone Encounter (Signed)
To PCP

## 2016-07-19 NOTE — Telephone Encounter (Signed)
Approved: #90 x 0 

## 2016-07-19 NOTE — Telephone Encounter (Signed)
Left refill on voice mail at pharmacy  

## 2016-07-31 ENCOUNTER — Other Ambulatory Visit: Payer: Self-pay | Admitting: Internal Medicine

## 2016-08-17 ENCOUNTER — Other Ambulatory Visit: Payer: Self-pay | Admitting: Internal Medicine

## 2016-08-18 NOTE — Telephone Encounter (Signed)
Last filled 07-22-16 #90 Last OV 03-26-16 Next OV 03-30-17  Forward to Dr Darnell Level in Dr Alla German absence.

## 2016-08-18 NOTE — Telephone Encounter (Signed)
plz phone in. 

## 2016-08-19 NOTE — Telephone Encounter (Signed)
[facility]adol [facility] into [id number] [place], [place] - [address] [facility] [facility] Phone: [phone] 

## 2016-09-16 ENCOUNTER — Other Ambulatory Visit: Payer: Self-pay | Admitting: Internal Medicine

## 2016-09-16 NOTE — Telephone Encounter (Signed)
Please advise on refill  Last filled on 08/18/16 by Dr. Danise Mina QTY:90 RF:0 Last OV 03/26/16

## 2016-09-17 NOTE — Telephone Encounter (Signed)
Approved: #90 x 0 

## 2016-09-17 NOTE — Telephone Encounter (Signed)
Left refill on voice mail at pharmacy  

## 2016-10-17 ENCOUNTER — Other Ambulatory Visit: Payer: Self-pay | Admitting: Internal Medicine

## 2016-10-18 NOTE — Telephone Encounter (Signed)
Rx called in to pharmacy. 

## 2016-10-18 NOTE — Telephone Encounter (Signed)
Ok to phone in Tramadol 

## 2016-10-18 NOTE — Telephone Encounter (Signed)
[place] pt, out of the office.Marland KitchenMarland KitchenLast filled 09/17/16... Please advise

## 2016-11-15 ENCOUNTER — Other Ambulatory Visit: Payer: Self-pay | Admitting: Internal Medicine

## 2016-11-15 NOTE — Telephone Encounter (Signed)
[place] pt.Marland KitchenMarland KitchenLast filled 10/18/2016... Please advise

## 2016-11-15 NOTE — Telephone Encounter (Signed)
Ok to be phoned in on or after 11/17/16

## 2016-11-17 MED ORDER — TRAMADOL HCL 50 MG PO TABS: 50.0000 mg | ORAL_TABLET | Freq: Three times a day (TID) | ORAL | 0 refills | Status: DC | PRN

## 2016-11-17 NOTE — Telephone Encounter (Signed)
Rx called in to pharmacy. 

## 2016-11-17 NOTE — Addendum Note (Signed)
Addended by: Lurlean Nanny on: 11/17/2016 11:22 AM   Modules accepted: Orders

## 2016-12-14 ENCOUNTER — Other Ambulatory Visit: Payer: Self-pay | Admitting: Internal Medicine

## 2016-12-14 NOTE — Telephone Encounter (Signed)
Approved: #90 x 0 

## 2016-12-14 NOTE — Telephone Encounter (Signed)
Last filled 11/17/16 #90... Please advise if okay to refill

## 2016-12-14 NOTE — Telephone Encounter (Signed)
Left refill on voice mail at pharmacy  

## 2017-01-12 ENCOUNTER — Other Ambulatory Visit: Payer: Self-pay | Admitting: Internal Medicine

## 2017-01-12 NOTE — Telephone Encounter (Signed)
Approved: #90 x 0 

## 2017-01-12 NOTE — Telephone Encounter (Signed)
Last Rx 12/14/2016. Last OV 03/2016

## 2017-01-12 NOTE — Telephone Encounter (Signed)
Rx called in to requested pharmacy 

## 2017-02-08 ENCOUNTER — Other Ambulatory Visit: Payer: Self-pay | Admitting: Internal Medicine

## 2017-02-08 NOTE — Telephone Encounter (Signed)
Last Rx 01/12/2017. Last OV 03/2016

## 2017-03-13 ENCOUNTER — Other Ambulatory Visit: Payer: Self-pay | Admitting: Internal Medicine

## 2017-03-14 NOTE — Telephone Encounter (Signed)
Last filled 02-13-17 #90 Last OV 03-26-16 Next OV 03-30-17

## 2017-03-30 ENCOUNTER — Encounter: Payer: Self-pay | Admitting: Internal Medicine

## 2017-04-11 ENCOUNTER — Other Ambulatory Visit: Payer: Self-pay | Admitting: Internal Medicine

## 2017-04-11 NOTE — Telephone Encounter (Signed)
Last filled 03-14-17 #90 Last OV 03-26-16 Next OV 06-30-17

## 2017-05-06 ENCOUNTER — Other Ambulatory Visit: Payer: Self-pay | Admitting: Internal Medicine

## 2017-05-09 ENCOUNTER — Other Ambulatory Visit: Payer: Self-pay | Admitting: Internal Medicine

## 2017-05-10 NOTE — Telephone Encounter (Signed)
Last filled 04-11-17 Last OV 03-26-16 Next OV 06-30-17

## 2017-06-07 ENCOUNTER — Other Ambulatory Visit: Payer: Self-pay | Admitting: Internal Medicine

## 2017-06-08 NOTE — Telephone Encounter (Signed)
Last filled 05-10-17 #90 Last OV 03-26-16 Next OV 06-30-17

## 2017-06-30 ENCOUNTER — Encounter: Payer: Self-pay | Admitting: Internal Medicine

## 2017-06-30 ENCOUNTER — Ambulatory Visit (INDEPENDENT_AMBULATORY_CARE_PROVIDER_SITE_OTHER): Payer: 59 | Admitting: Internal Medicine

## 2017-06-30 VITALS — BP 136/86 | HR 74 | Temp 98.1°F | Ht 74.0 in | Wt 273.0 lb

## 2017-06-30 DIAGNOSIS — Z23 Encounter for immunization: Secondary | ICD-10-CM | POA: Diagnosis not present

## 2017-06-30 DIAGNOSIS — Z0000 Encounter for general adult medical examination without abnormal findings: Secondary | ICD-10-CM | POA: Diagnosis not present

## 2017-06-30 DIAGNOSIS — K219 Gastro-esophageal reflux disease without esophagitis: Secondary | ICD-10-CM

## 2017-06-30 DIAGNOSIS — G4733 Obstructive sleep apnea (adult) (pediatric): Secondary | ICD-10-CM | POA: Diagnosis not present

## 2017-06-30 DIAGNOSIS — M5137 Other intervertebral disc degeneration, lumbosacral region: Secondary | ICD-10-CM

## 2017-06-30 DIAGNOSIS — J4530 Mild persistent asthma, uncomplicated: Secondary | ICD-10-CM

## 2017-06-30 MED ORDER — TRAMADOL HCL 50 MG PO TABS: 50.0000 mg | ORAL_TABLET | Freq: Four times a day (QID) | ORAL | 0 refills | Status: DC

## 2017-06-30 NOTE — Assessment & Plan Note (Signed)
Controlled Rarely needs the inhaler

## 2017-06-30 NOTE — Assessment & Plan Note (Signed)
Healthy but really out of shape Limited in exercise--needs to work on better eating Discussed PSA Colon due later this year Tetanus booster today Yearly flu vaccine

## 2017-06-30 NOTE — Assessment & Plan Note (Signed)
Ongoing, at times severe, pain Will increase tramadol to 4 daily Can use ibuprofen also Will check renal function

## 2017-06-30 NOTE — Patient Instructions (Signed)
DASH Eating Plan DASH stands for "Dietary Approaches to Stop Hypertension." The DASH eating plan is a healthy eating plan that has been shown to reduce high blood pressure (hypertension). It may also reduce your risk for type 2 diabetes, heart disease, and stroke. The DASH eating plan may also help with weight loss. What are tips for following this plan? General guidelines  Avoid eating more than 2,300 mg (milligrams) of salt (sodium) a day. If you have hypertension, you may need to reduce your sodium intake to 1,500 mg a day.  Limit alcohol intake to no more than 1 drink a day for nonpregnant women and 2 drinks a day for men. One drink equals 12 oz of beer, 5 oz of wine, or 1 oz of hard liquor.  Work with your health care provider to maintain a healthy body weight or to lose weight. Ask what an ideal weight is for you.  Get at least 30 minutes of exercise that causes your heart to beat faster (aerobic exercise) most days of the week. Activities may include walking, swimming, or biking.  Work with your health care provider or diet and nutrition specialist (dietitian) to adjust your eating plan to your individual calorie needs. Reading food labels  Check food labels for the amount of sodium per serving. Choose foods with less than 5 percent of the Daily Value of sodium. Generally, foods with less than 300 mg of sodium per serving fit into this eating plan.  To find whole grains, look for the word "whole" as the first word in the ingredient list. Shopping  Buy products labeled as "low-sodium" or "no salt added."  Buy fresh foods. Avoid canned foods and premade or frozen meals. Cooking  Avoid adding salt when cooking. Use salt-free seasonings or herbs instead of table salt or sea salt. Check with your health care provider or pharmacist before using salt substitutes.  Do not fry foods. Cook foods using healthy methods such as baking, boiling, grilling, and broiling instead.  Cook with  heart-healthy oils, such as olive, canola, soybean, or sunflower oil. Meal planning   Eat a balanced diet that includes: ? 5 or more servings of fruits and vegetables each day. At each meal, try to fill half of your plate with fruits and vegetables. ? Up to 6-8 servings of whole grains each day. ? Less than 6 oz of lean meat, poultry, or fish each day. A 3-oz serving of meat is about the same size as a deck of cards. One egg equals 1 oz. ? 2 servings of low-fat dairy each day. ? A serving of nuts, seeds, or beans 5 times each week. ? Heart-healthy fats. Healthy fats called Omega-3 fatty acids are found in foods such as flaxseeds and coldwater fish, like sardines, salmon, and mackerel.  Limit how much you eat of the following: ? Canned or prepackaged foods. ? Food that is high in trans fat, such as fried foods. ? Food that is high in saturated fat, such as fatty meat. ? Sweets, desserts, sugary drinks, and other foods with added sugar. ? Full-fat dairy products.  Do not salt foods before eating.  Try to eat at least 2 vegetarian meals each week.  Eat more home-cooked food and less restaurant, buffet, and fast food.  When eating at a restaurant, ask that your food be prepared with less salt or no salt, if possible. What foods are recommended? The items listed may not be a complete list. Talk with your dietitian about what   dietary choices are best for you. Grains Whole-grain or whole-wheat bread. Whole-grain or whole-wheat pasta. Brown rice. Oatmeal. Quinoa. Bulgur. Whole-grain and low-sodium cereals. Pita bread. Low-fat, low-sodium crackers. Whole-wheat flour tortillas. Vegetables Fresh or frozen vegetables (raw, steamed, roasted, or grilled). Low-sodium or reduced-sodium tomato and vegetable juice. Low-sodium or reduced-sodium tomato sauce and tomato paste. Low-sodium or reduced-sodium canned vegetables. Fruits All fresh, dried, or frozen fruit. Canned fruit in natural juice (without  added sugar). Meat and other protein foods Skinless chicken or turkey. Ground chicken or turkey. Pork with fat trimmed off. Fish and seafood. Egg whites. Dried beans, peas, or lentils. Unsalted nuts, nut butters, and seeds. Unsalted canned beans. Lean cuts of beef with fat trimmed off. Low-sodium, lean deli meat. Dairy Low-fat (1%) or fat-free (skim) milk. Fat-free, low-fat, or reduced-fat cheeses. Nonfat, low-sodium ricotta or cottage cheese. Low-fat or nonfat yogurt. Low-fat, low-sodium cheese. Fats and oils Soft margarine without trans fats. Vegetable oil. Low-fat, reduced-fat, or light mayonnaise and salad dressings (reduced-sodium). Canola, safflower, olive, soybean, and sunflower oils. Avocado. Seasoning and other foods Herbs. Spices. Seasoning mixes without salt. Unsalted popcorn and pretzels. Fat-free sweets. What foods are not recommended? The items listed may not be a complete list. Talk with your dietitian about what dietary choices are best for you. Grains Baked goods made with fat, such as croissants, muffins, or some breads. Dry pasta or rice meal packs. Vegetables Creamed or fried vegetables. Vegetables in a cheese sauce. Regular canned vegetables (not low-sodium or reduced-sodium). Regular canned tomato sauce and paste (not low-sodium or reduced-sodium). Regular tomato and vegetable juice (not low-sodium or reduced-sodium). Pickles. Olives. Fruits Canned fruit in a light or heavy syrup. Fried fruit. Fruit in cream or butter sauce. Meat and other protein foods Fatty cuts of meat. Ribs. Fried meat. Bacon. Sausage. Bologna and other processed lunch meats. Salami. Fatback. Hotdogs. Bratwurst. Salted nuts and seeds. Canned beans with added salt. Canned or smoked fish. Whole eggs or egg yolks. Chicken or turkey with skin. Dairy Whole or 2% milk, cream, and half-and-half. Whole or full-fat cream cheese. Whole-fat or sweetened yogurt. Full-fat cheese. Nondairy creamers. Whipped toppings.  Processed cheese and cheese spreads. Fats and oils Butter. Stick margarine. Lard. Shortening. Ghee. Bacon fat. Tropical oils, such as coconut, palm kernel, or palm oil. Seasoning and other foods Salted popcorn and pretzels. Onion salt, garlic salt, seasoned salt, table salt, and sea salt. Worcestershire sauce. Tartar sauce. Barbecue sauce. Teriyaki sauce. Soy sauce, including reduced-sodium. Steak sauce. Canned and packaged gravies. Fish sauce. Oyster sauce. Cocktail sauce. Horseradish that you find on the shelf. Ketchup. Mustard. Meat flavorings and tenderizers. Bouillon cubes. Hot sauce and Tabasco sauce. Premade or packaged marinades. Premade or packaged taco seasonings. Relishes. Regular salad dressings. Where to find more information:  National Heart, Lung, and Blood Institute: www.nhlbi.nih.gov  American Heart Association: www.heart.org Summary  The DASH eating plan is a healthy eating plan that has been shown to reduce high blood pressure (hypertension). It may also reduce your risk for type 2 diabetes, heart disease, and stroke.  With the DASH eating plan, you should limit salt (sodium) intake to 2,300 mg a day. If you have hypertension, you may need to reduce your sodium intake to 1,500 mg a day.  When on the DASH eating plan, aim to eat more fresh fruits and vegetables, whole grains, lean proteins, low-fat dairy, and heart-healthy fats.  Work with your health care provider or diet and nutrition specialist (dietitian) to adjust your eating plan to your individual   calorie needs. This information is not intended to replace advice given to you by your health care provider. Make sure you discuss any questions you have with your health care provider. Document Released: 12/31/2010 Document Revised: 01/05/2016 Document Reviewed: 01/05/2016 Elsevier Interactive Patient Education  2018 Elsevier Inc.  

## 2017-06-30 NOTE — Assessment & Plan Note (Signed)
Uses the CPAP regularly

## 2017-06-30 NOTE — Addendum Note (Signed)
Addended by: Pilar Grammes on: 06/30/2017 04:23 PM   Modules accepted: Orders

## 2017-06-30 NOTE — Progress Notes (Signed)
Subjective:    Patient ID: Lee Valenzuela, male    DOB: 08-14-1960, 57 y.o.   MRN: [id number]  HPI Here for physical  Still having troubles with RLS Seems worse especially if he has an active day Ongoing back problems--thinks there is nerve damage (?from the surgery) Trouble in any one position Numbness in left leg in spots and 2 toes on left foot Thinks he needs increase in tramadol Also using ibuprofen 600mg  tid at highest---has backed off some  Continues on the ranitidine regularly No active heartburn or dysphagia  Asthma is good Not using the Rx much  Uses CPAP every night This is very helpful  Current Outpatient Medications on File Prior to Visit  Medication Sig Dispense Refill  . albuterol (PROVENTIL) (2.5 MG/3ML) 0.083% nebulizer solution USE 1 VIAL IN NEBULIZER AS DIRECTED 75 mL 3  . ibuprofen (ADVIL,MOTRIN) 200 MG tablet Take 200 mg by mouth as needed. For pain     . Multiple Vitamin (MULTIVITAMIN) capsule Take 1 capsule by mouth daily.    . ranitidine (ZANTAC) 150 MG tablet Take 150 mg by mouth at bedtime.    . traMADol (ULTRAM) 50 MG tablet TAKE 1 TABLET BY MOUTH THREE TIMES A DAY 90 tablet 0  . VENTOLIN HFA 108 (90 Base) MCG/ACT inhaler INHALE 2 PUFFS BY MOUTH EVERY 6 HOURS AS NEEDED FOR WHEEZING 18 Inhaler 3   No current facility-administered medications on file prior to visit.     Allergies  Allergen Reactions  . Ciprofloxacin     REACTION: ? Rash    Past Medical History:  Diagnosis Date  . Anxiety    with panic  . Asthma   . Gastroenteritis 6/02  . GERD (gastroesophageal reflux disease)   . Hyperlipidemia   . Lumbar disc disease   . Pneumonia 1995  . Sleep apnea    USES CPAP    Past Surgical History:  Procedure Laterality Date  . KNEE ARTHROSCOPY  2/12   L knee  . LAMINECTOMY  1997  . LUMBAR SPINE SURGERY  2007   x 2, fusion  . MENISCUS REPAIR  2/12   [employer]  . SPINAL FUSION      Family History  Problem Relation Age of  Onset  . Ovarian cancer Paternal Grandmother   . Liver cancer Maternal Grandfather   . Depression Father        nervous breakdown  . Hypertension Father   . Ulcers Father   . Cancer Father        dying of myelodysplasia  . Bipolar disorder Son   . Schizophrenia Son   . Other Sister        nervous breakdown  . Asthma Sister   . Allergies Other        In family  . Coronary artery disease Neg Hx   . Diabetes Neg Hx   . Migraines Neg Hx   . Colon cancer Neg Hx   . Esophageal cancer Neg Hx   . Rectal cancer Neg Hx   . Stomach cancer Neg Hx     Social History   Socioeconomic History  . Marital status: Married    Spouse name: Not on file  . Number of children: 3  . Years of education: Not on file  . Highest education level: Not on file  Occupational History  . Occupation: Journalist, newspaper for [employer]  . Emergency planning/management officer  strain: Not on file  . Food insecurity:    Worry: Not on file    Inability: Not on file  . Transportation needs:    Medical: Not on file    Non-medical: Not on file  Tobacco Use  . Smoking status: Never Smoker  . Smokeless tobacco: Never Used  Substance and Sexual Activity  . Alcohol use: Yes    Comment: OCC BEER  . Drug use: Not on file  . Sexual activity: Not on file  Lifestyle  . Physical activity:    Days per week: Not on file    Minutes per session: Not on file  . Stress: Not on file  Relationships  . Social connections:    Talks on phone: Not on file    Gets together: Not on file    Attends religious service: Not on file    Active member of club or organization: Not on file    Attends meetings of clubs or organizations: Not on file    Relationship status: Not on file  . Intimate partner violence:    Fear of current or ex partner: Not on file    Emotionally abused: Not on file    Physically abused: Not on file    Forced sexual activity: Not on file  Other Topics Concern  .  Not on file  Social History Narrative  . Not on file   Review of Systems  Constitutional: Positive for unexpected weight change. Negative for fatigue.       Has continued to gain weight Wears seat belt  HENT: Negative for dental problem, hearing loss and tinnitus.   Eyes:       Has ocular migraines---no headaches No diplopia  Respiratory: Negative for cough, chest tightness and shortness of breath.   Cardiovascular: Negative for chest pain and palpitations.       Slight indentation from socks at the end of the day  Gastrointestinal: Negative for blood in stool and constipation.  Endocrine: Negative for polydipsia and polyuria.  Genitourinary: Negative for difficulty urinating and urgency.       No sexual problems  Musculoskeletal: Positive for back pain. Negative for arthralgias and joint swelling.  Skin: Negative for rash.       No suspicious lesions  Allergic/Immunologic: Positive for environmental allergies. Negative for immunocompromised state.       Uses OTC occasionally  Neurological: Positive for headaches. Negative for dizziness, syncope and light-headedness.  Hematological: Negative for adenopathy. Does not bruise/bleed easily.  Psychiatric/Behavioral: Negative for dysphoric mood and sleep disturbance. The patient is not nervous/anxious.        Objective:   Physical Exam  Constitutional: He appears well-developed. No distress.  HENT:  Head: Normocephalic and atraumatic.  Right Ear: External ear normal.  Left Ear: External ear normal.  Mouth/Throat: Oropharynx is clear and moist. No oropharyngeal exudate.  Eyes: Pupils are equal, round, and reactive to light. Conjunctivae are normal.  Neck: No thyromegaly present.  Cardiovascular: Normal rate, regular rhythm, normal heart sounds and intact distal pulses. Exam reveals no gallop.  No murmur heard. Respiratory: Effort normal and breath sounds normal. No respiratory distress. He has no wheezes. He has no rales.  GI:  Soft. There is no tenderness.  Musculoskeletal: He exhibits no edema or tenderness.  Lymphadenopathy:    He has no cervical adenopathy.  Skin: No rash noted. No erythema.  Psychiatric: He has a normal mood and affect. His behavior is normal.  Assessment & Plan:

## 2017-06-30 NOTE — Assessment & Plan Note (Signed)
Doing okay Urged him to continue the ranitidine on ibuprofen

## 2017-07-01 LAB — PAIN MGMT, PROFILE 8 W/CONF, U
6 ACETYLMORPHINE: NEGATIVE ng/mL (ref <10)
ALCOHOL METABOLITES: NEGATIVE ng/mL (ref <500)
Amphetamines: NEGATIVE ng/mL (ref <500)
BUPRENORPHINE, URINE: NEGATIVE ng/mL (ref <5)
Benzodiazepines: NEGATIVE ng/mL (ref <100)
Cocaine Metabolite: NEGATIVE ng/mL (ref <150)
Creatinine: 109.6 mg/dL
MARIJUANA METABOLITE: NEGATIVE ng/mL (ref <20)
MDMA: NEGATIVE ng/mL (ref <500)
OXYCODONE: NEGATIVE ng/mL (ref <100)
Opiates: NEGATIVE ng/mL (ref <100)
Oxidant: NEGATIVE ug/mL (ref <200)
pH: 6.89 (ref 4.5–9.0)

## 2017-07-01 LAB — COMPREHENSIVE METABOLIC PANEL
ALK PHOS: 43 U/L (ref 39–117)
ALT: 21 U/L (ref 0–53)
AST: 19 U/L (ref 0–37)
Albumin: 4.3 g/dL (ref 3.5–5.2)
BUN: 10 mg/dL (ref 6–23)
CO2: 27 meq/L (ref 19–32)
Calcium: 9.2 mg/dL (ref 8.4–10.5)
Chloride: 104 mEq/L (ref 96–112)
Creatinine, Ser: 1.01 mg/dL (ref 0.40–1.50)
GFR: 81.39 mL/min (ref >60.00)
GLUCOSE: 76 mg/dL (ref 70–99)
Potassium: 3.9 mEq/L (ref 3.5–5.1)
SODIUM: 139 meq/L (ref 135–145)
Total Bilirubin: 0.3 mg/dL (ref 0.2–1.2)
Total Protein: 7.1 g/dL (ref 6.0–8.3)

## 2017-07-01 LAB — CBC
HCT: 40.2 % (ref 39.0–52.0)
HEMOGLOBIN: 13.9 g/dL (ref 13.0–17.0)
MCHC: 34.5 g/dL (ref 30.0–36.0)
MCV: 85.3 fl (ref 78.0–100.0)
Platelets: 217 10*3/uL (ref 150.0–400.0)
RBC: 4.72 Mil/uL (ref 4.22–5.81)
RDW: 13.4 % (ref 11.5–15.5)
WBC: 6.5 10*3/uL (ref 4.0–10.5)

## 2017-07-05 ENCOUNTER — Encounter: Payer: Self-pay | Admitting: Internal Medicine

## 2017-07-05 ENCOUNTER — Other Ambulatory Visit: Payer: Self-pay | Admitting: Internal Medicine

## 2017-07-06 ENCOUNTER — Encounter: Payer: Self-pay | Admitting: Internal Medicine

## 2017-07-06 ENCOUNTER — Other Ambulatory Visit: Payer: Self-pay | Admitting: Internal Medicine

## 2017-07-06 NOTE — Telephone Encounter (Signed)
I spoke with pt and asked for him to hold while I called [facility]; I spoke with Mechele Claude at [employer]. Mechele Claude said did receive tramadol rx on 06/30/17 but was on regulatory hold; pt cannot get filled until 07/08/17. Pt said Dr Silvio Pate increased how often he could take med. I spoke with Mechele Claude again and she checked with pharmacist and they will fill tramadol rx today. Pt voiced understanding and was appreciative and will pick up at [facility] on 07/07/17. FYI to Dr Silvio Pate due to recent emails.

## 2017-07-06 NOTE — Telephone Encounter (Signed)
Copied from [place] [phone]. Topic: Quick Communication - Rx Refill/Question >> Jul 06, 2017  1:56 PM Marin Olp L wrote: Medication: Tramedol (pharmacy said they never received it although it was confirmed on 06/06)  Has the patient contacted their pharmacy? Yes.   (Agent: If no, request that the patient contact the pharmacy for the refill.) (Agent: If yes, when and what did the pharmacy advise?)  Preferred Pharmacy (with phone number or street name): [id number] [place], [place]: Please be advised that RX refills may take up to 3 business days. We ask that you follow-up with your pharmacy.

## 2017-08-05 ENCOUNTER — Other Ambulatory Visit: Payer: Self-pay | Admitting: Internal Medicine

## 2017-08-05 NOTE — Telephone Encounter (Signed)
Last filled 07-06-17 #120 Last OV/CPE 06-30-17 No Future OV  [facility]

## 2017-09-02 ENCOUNTER — Encounter: Payer: Self-pay | Admitting: Internal Medicine

## 2017-09-04 ENCOUNTER — Other Ambulatory Visit: Payer: Self-pay | Admitting: Internal Medicine

## 2017-09-05 NOTE — Telephone Encounter (Signed)
Last filled 08-05-17 #120 Last OV 06-30-17 No Future OV

## 2017-10-05 ENCOUNTER — Other Ambulatory Visit: Payer: Self-pay | Admitting: Internal Medicine

## 2017-10-05 NOTE — Telephone Encounter (Signed)
Last filled 09-05-17 #120 Last OV 06-30-17 No Future OV

## 2017-11-01 ENCOUNTER — Other Ambulatory Visit: Payer: Self-pay | Admitting: Internal Medicine

## 2017-11-01 NOTE — Telephone Encounter (Signed)
Last filled 10-05-17 #120 Last OV 06-30-17 No Future OV

## 2017-12-01 ENCOUNTER — Other Ambulatory Visit: Payer: Self-pay | Admitting: Internal Medicine

## 2017-12-01 NOTE — Telephone Encounter (Signed)
Last filled 11-01-17 #60 Last OV 06-30-17 No Future OV

## 2017-12-30 ENCOUNTER — Other Ambulatory Visit: Payer: Self-pay | Admitting: Internal Medicine

## 2018-01-02 ENCOUNTER — Encounter: Payer: Self-pay | Admitting: Sports Medicine

## 2018-01-02 ENCOUNTER — Ambulatory Visit: Payer: Self-pay | Admitting: Sports Medicine

## 2018-01-02 ENCOUNTER — Ambulatory Visit: Payer: Self-pay

## 2018-01-02 VITALS — BP 150/90 | HR 89 | Ht 74.0 in | Wt 281.2 lb

## 2018-01-02 DIAGNOSIS — M25511 Pain in right shoulder: Secondary | ICD-10-CM

## 2018-01-02 DIAGNOSIS — G2589 Other specified extrapyramidal and movement disorders: Secondary | ICD-10-CM | POA: Diagnosis not present

## 2018-01-02 DIAGNOSIS — M75101 Unspecified rotator cuff tear or rupture of right shoulder, not specified as traumatic: Secondary | ICD-10-CM | POA: Diagnosis not present

## 2018-01-02 MED ORDER — NITROGLYCERIN 0.2 MG/HR TD PT24: MEDICATED_PATCH | TRANSDERMAL | 1 refills | Status: DC

## 2018-01-02 NOTE — Progress Notes (Signed)

## 2018-01-02 NOTE — Patient Instructions (Addendum)
  Nitroglycerin Protocol   Apply 1/4 nitroglycerin patch to affected area daily.  Change position of patch within the affected area every 24 hours.  You may experience a headache during the first 1-2 weeks of using the patch, these should subside.  If you experience headaches after beginning nitroglycerin patch treatment, you may take your preferred over the counter pain reliever.  Another side effect of the nitroglycerin patch is skin irritation or rash related to patch adhesive.  Please notify our office if you develop more severe headaches or rash, and stop the patch.  Tendon healing with nitroglycerin patch may require 12 to 24 weeks depending on the extent of injury.  Men should not use if taking Viagra, Cialis, or Levitra.   Do not use if you have migraines or rosacea.     Please perform the exercise program that we have prepared for you and gone over in detail on a daily basis.  In addition to the handout you were provided you can access your program through: [email or web address]   Your unique program code is:  [email or web address]

## 2018-01-02 NOTE — Telephone Encounter (Signed)
Pt said he put request into [facility] on 12/28/17; pt is almost out of med and request refill tramadol today. I explained we did not get electronic request until 12/30/17 at 4:50 pm. Pt voiced understanding.

## 2018-01-02 NOTE — Progress Notes (Signed)
Lee Valenzuela. [place], [place] at [place] - 57 y.o. male MRN [id number]  Date of birth: 1960-11-13  Visit Date:01/02/2018    PCP: Venia Carbon, MD   Referred by: Venia Carbon, MD   SUBJECTIVE:  Chief Complaint  Patient presents with  . New Patient (Initial Visit)    Shoulder pain    HPI: Patient has worsening right shoulder pain over the past 3 months.  He did feel like his shoulder popped when he quickly reached for his cell phone.  He is having mild to severe pain depending on the activity levels.  He does have some tingling and radiation that is mild into the right hand along the ulnar distribution.  He has some weakness with the right upper extremity especially with reaching out.  He has some limitations and worsening pain with functional internal rotation such as reaching behind him.  It is improved with ice and ibuprofen, naproxen and Tylenol that he has been intermittently taking.  He has tried some tramadol.  He was wearing a sling initially it was helpful at [facility].  He has not had any issues recently but did have had issues 20 years ago when he was hanging wallpaper.  REVIEW OF SYSTEMS: Reports night time disturbances. Denies fevers, chills, or night sweats. Denies unexplained weight loss. Denies personal history of cancer. Denies changes in bowel or bladder habits. Denies recent unreported falls. Denies new or worsening dyspnea or wheezing. Denies headaches or dizziness.  Reports numbness, tingling or weakness  In the extremities.  Denies dizziness or presyncopal episodes Denies lower extremity edema   HISTORY:  Prior history reviewed and updated per electronic medical record.  Social History   Occupational History  . Occupation: Probation officer    Comment:    Tobacco Use  . Smoking status: Never Smoker  . Smokeless tobacco: Never Used    Substance and Sexual Activity  . Alcohol use: Yes    Comment: OCC BEER  . Drug use: Not on file  . Sexual activity: Not on file   Social History   Social History Narrative  . Not on file    Past Medical History:  Diagnosis Date  . Anxiety    with panic  . Asthma   . Gastroenteritis 6/02  . GERD (gastroesophageal reflux disease)   . Hyperlipidemia   . Lumbar disc disease   . Pneumonia 1995  . Sleep apnea    USES CPAP   Past Surgical History:  Procedure Laterality Date  . KNEE ARTHROSCOPY  2/12   L knee  . LAMINECTOMY  1997  . LUMBAR SPINE SURGERY  2007   x 2, fusion  . MENISCUS REPAIR  2/12   [employer]  . SPINAL FUSION     family history includes Allergies in an other family member; Asthma in his sister; Bipolar disorder in his son; Cancer in his father; Depression in his father; Hypertension in his father; Liver cancer in his maternal grandfather; Other in his sister; Ovarian cancer in his paternal grandmother; Schizophrenia in his son; Ulcers in his father. There is no history of Coronary artery disease, Diabetes, Migraines, Colon cancer, Esophageal cancer, Rectal cancer, or Stomach cancer.  DATA OBTAINED & REVIEWED:  Recent Labs    06/30/17 1506  CALCIUM 9.2  AST 19  ALT 21   No problems updated. No specialty comments available.  OBJECTIVE:  VS:  HT:6\' 2"  (188 cm)   WT:281 lb 3.2 oz (127.6 kg)  BMI:36.09    BP: (!) 150/90   HR:89bpm  TEMP: ( )  RESP:94 %   PHYSICAL EXAM: CONSTITUTIONAL: Well-developed, Well-nourished and In no acute distress PSYCHIATRIC : Alert & appropriately interactive. and Not depressed or anxious appearing. RESPIRATORY : No increased work of breathing and Trachea Midline EYES : Pupils are equal., EOM intact without nystagmus. and No scleral icterus.  VASCULAR EXAM : Warm and well perfused NEURO: unremarkable  MSK Exam:  Right shoulder  Well aligned No significant deformity. No overlying skin changes No focal  bony tenderness He has scapular dyskinesis with overhead range of motion.  Internal rotation and external rotation strength is intact to manual muscle testing but is slightly painful.  Empty can has a small amount of pain but this is mild.  He has a painful Hawkins as well as Neer's test.       ASSESSMENT:   1. Acute pain of right shoulder   2. Rotator cuff syndrome of right shoulder   3. Scapular dyskinesis     PROCEDURES:  Home Therapeutic exercises prescribed per procedure note.      PLAN:  Pertinent additional documentation may be included in corresponding procedure notes, imaging studies, problem based documentation and patient instructions.  No problem-specific Assessment & Plan notes found for this encounter.   Symptoms are consistent with rotator cuff tendinopathy and rotator cuff syndrome.    Home Therapeutic exercises prescribed today per procedure note.  TENDINOPATHY - Discussed that the anticipated amount of time for healing is 12- 24 weeks for Tendinopathic changes.  Emphasized the importance of improving blood flow as well as eccentric loading of the tendon.  NITRO PROTOCOL - Discussed options with the patient today including biologic treatment with topical nitroglycerin. Patient has no contraindications & understands the risks, benefits and intentions of treatment. Emphasized the importance of rotating sites as well as appropriate and expected adverse reactions including orthostasis, headache, adhesive sensitivity.    Activity modifications and the importance of avoiding exacerbating activities (limiting pain to no more than a 4 / 10 during or following activity) recommended and discussed.  Discussed red flag symptoms that warrant earlier emergent evaluation and patient voices understanding.   Meds ordered this encounter  Medications  . DISCONTD: nitroGLYCERIN (NITRODUR - DOSED IN MG/24 HR) 0.2 mg/hr patch    Sig: Place 1/4 to 1/2 of a patch over affected region.  Remove and replace once daily.  Slightly alter skin placement daily    Dispense:  30 patch    Refill:  1    For musculoskeletal purposes.  Okay to cut patch.   Lab Orders  No laboratory test(s) ordered today   Imaging Orders     [place] [facility] POCT ULTRASOUND Referral Orders  No referral(s) requested today    At follow up will plan to consider: repeat MSK Ultrasound  Return in about 6 weeks (around 02/13/2018).          Gerda Diss, [facility] Physician

## 2018-01-02 NOTE — Procedures (Signed)
LIMITED MSK ULTRASOUND OF Right shoulder Images were obtained and interpreted by myself, Teresa Coombs, DO  Images have been saved and stored to PACS system. Images obtained on: GE S7 Ultrasound machine  FINDINGS:  Biceps Tendon: Normal Pec Major Insertion: Normal Subscapularis Tendon: Slight thickening Supraspinatus Tendon: Marked thickening with hypoechoic change as well as a small bursal formation Infraspinatus/Teres Minor Tendon: Normal AC Joint: Normal JOINT: No significant GH spurring appreciated  LABRUM: Not evaluated    IMPRESSION:  1. Rotator cuff tendinopathy involving the subscapularis and supraspinatus tendon no evidence of full-thickness tear

## 2018-01-02 NOTE — Telephone Encounter (Signed)
Last filled 12-01-17 #120 Last OV 06-30-17 Next OV with Sports Med for shoulder pain today at [employer]

## 2018-01-26 ENCOUNTER — Other Ambulatory Visit: Payer: Self-pay | Admitting: Internal Medicine

## 2018-01-26 NOTE — Telephone Encounter (Signed)
He is getting it early just about every month--- 1 year ago his Rx was usually on the 15-18th of the month. Now he is at the 9th. It is now Jan 2----so that is a week early. I am not sure what his concern is---it is too early and he regularly asks early but we can't give it early every month (and can't do it a week early anyway). We can refill it on Monday (1/6). There is much scrutiny of all controlled substances--- let him know that every 3 month visits and regular urine drug screens are routine now (and we have tried to hold off on the tramadol)---and may have to be for the tramadol as well

## 2018-01-26 NOTE — Telephone Encounter (Signed)
Spoke to pt. I advised him to send a MyChart message every month for his refill request up to 48 hours prior to the fill date. I did tell him that if he calls on a Friday afternoon, it would be Monday before a refill is done.

## 2018-01-26 NOTE — Telephone Encounter (Signed)
Spoke to pt who states he continues to have issues with having his tramadol filled. Pt states he is always told he is too early to make the request, but pt states he is always told it takes 48-72hrs to fill it. He is not due until 12/9, as he ran out, waiting on his meds to be called in,  but is not wanting to wait until the last minute to have it filled. He is upset that is request was denied this am, and that he is "tired of the run-around and blaming" and him receiving his medication on time is becoming an issue. Pt request message be sent directly to Dr Silvio Pate, so that he is made aware of the situation. pls advise

## 2018-01-30 ENCOUNTER — Other Ambulatory Visit: Payer: Self-pay

## 2018-01-30 MED ORDER — TRAMADOL HCL 50 MG PO TABS: 50.0000 mg | ORAL_TABLET | Freq: Four times a day (QID) | ORAL | 0 refills | Status: DC

## 2018-01-30 NOTE — Telephone Encounter (Signed)
Per last refill request, refill would be given today.

## 2018-02-03 ENCOUNTER — Telehealth: Payer: Self-pay | Admitting: Sports Medicine

## 2018-02-03 NOTE — Telephone Encounter (Signed)
Copied from [place] [phone]. Topic: General - Inquiry >> Feb 03, 2018  4:09 PM Margot Ables wrote: Reason for CRM: pt is still having pain in right shoulder. He is very busy at work per his wife Arbie Cookey and not able to come in for appt right now. He doesn't want pain medication but possibly an anti-inflammatory medication. Pt has taken Tramadol, OTC ibuprofen, tylenol, and aspirin. Please call to discuss.   [id number] [place], [place] [phone] (Phone) [phone] (Fax)

## 2018-02-03 NOTE — Telephone Encounter (Signed)
See note

## 2018-02-06 MED ORDER — CELECOXIB 100 MG PO CAPS: 100.0000 mg | ORAL_CAPSULE | Freq: Two times a day (BID) | ORAL | 2 refills | Status: AC

## 2018-02-06 NOTE — Telephone Encounter (Signed)
Forwarding to Dr. Rigby to advise.  

## 2018-02-06 NOTE — Telephone Encounter (Signed)
Rx for Celebrex sent to pharmacy. Has appointment next week.  I would like to make sure we see him at that time

## 2018-02-06 NOTE — Telephone Encounter (Signed)
Called pt and left VM (ok per DPR) advising rx for anti-inflammatory has been sent in and to please keep scheduled OV on 02/13/2018 at 2:00 PM.

## 2018-02-10 ENCOUNTER — Ambulatory Visit (INDEPENDENT_AMBULATORY_CARE_PROVIDER_SITE_OTHER): Payer: No Typology Code available for payment source | Admitting: Sports Medicine

## 2018-02-10 ENCOUNTER — Ambulatory Visit (INDEPENDENT_AMBULATORY_CARE_PROVIDER_SITE_OTHER): Payer: No Typology Code available for payment source

## 2018-02-10 ENCOUNTER — Encounter: Payer: Self-pay | Admitting: Sports Medicine

## 2018-02-10 VITALS — BP 152/90 | HR 68 | Ht 74.0 in | Wt 284.8 lb

## 2018-02-10 DIAGNOSIS — M4722 Other spondylosis with radiculopathy, cervical region: Secondary | ICD-10-CM

## 2018-02-10 DIAGNOSIS — M25511 Pain in right shoulder: Secondary | ICD-10-CM

## 2018-02-10 DIAGNOSIS — M75101 Unspecified rotator cuff tear or rupture of right shoulder, not specified as traumatic: Secondary | ICD-10-CM | POA: Diagnosis not present

## 2018-02-10 DIAGNOSIS — G2589 Other specified extrapyramidal and movement disorders: Secondary | ICD-10-CM | POA: Diagnosis not present

## 2018-02-10 MED ORDER — PREDNISONE 20 MG PO TABS: ORAL_TABLET | ORAL | 0 refills | Status: DC

## 2018-02-10 MED ORDER — GABAPENTIN 300 MG PO CAPS: ORAL_CAPSULE | ORAL | 1 refills | Status: DC

## 2018-02-10 NOTE — Progress Notes (Signed)
Juanda Bond. Jacilyn Sanpedro, [place] at [place] - 58 y.o. male MRN [id number]  Date of birth: 14-Nov-1960  Visit Date: February 10, 2018  PCP: Venia Carbon, MD   Referred by: Venia Carbon, MD  SUBJECTIVE:  Chief Complaint  Patient presents with  . f/u R shoulder pain    HPI: Patient is here to follow-up right shoulder and arm and neck pain.  He reports no improvement since his last visit and actually just gotten to the point where he is sleeping in a [place] due to the fact he is unable to lay completely flat.  The pain localizes to the right shoulder but is described as a numbness and weakness that radiates all the way down into the hand.  Any type of shoulder motion including writing seems to bother his shoulder.  It is worse with right-sided side-lying and is worse with any type of pressure over the posterior scapula.  He has been using nitroglycerin protocol, ibuprofen, Tylenol aspirin and recently took a prescription for Celebrex with only minimal improvement although some.  The tramadol that he uses for his chronic low back pain has been helpful but is it is still keeping him awake.  He does report this seems similar to when he was having back issues and pain radiating down the posterior aspect of his leg.  REVIEW OF SYSTEMS: Significant nighttime disturbances.  He does have numbness and tingling in the right upper extremity that is diffuse in a non-myotomal distribution.  He has an otherwise -12 point review of systems.  HISTORY:  Prior history reviewed and updated per electronic medical record.  Social History   Occupational History  . Occupation: Probation officer    Comment:    Tobacco Use  . Smoking status: Never Smoker  . Smokeless tobacco: Never Used  Substance and Sexual Activity  . Alcohol use: Yes    Comment: OCC BEER  . Drug use: Not on file  .  Sexual activity: Not on file   Social History   Social History Narrative  . Not on file     DATA OBTAINED & REVIEWED:  Recent Labs    06/30/17 1506  CALCIUM 9.2  AST 19  ALT 21   No problems updated. No specialty comments available. X-rays of the shoulder and neck obtained today that showed significant degenerative changes multilevel and his cervical spine most notably at C4-5 C5-6 and C6-7.  He has a straightening of the cervical lordosis.  His right shoulder is effectively normal with only minimal amount of degenerative change within the [facility] joint.  OBJECTIVE:  VS:  HT:6\' 2"  (188 cm)   WT:284 lb 12.8 oz (129.2 kg)  BMI:36.55    BP: (!) 152/90  HR:68bpm  TEMP: ( )  RESP:98 %   PHYSICAL EXAM: Adult male.  In no acute respiratory distress but slightly uncomfortable and guards his right shoulder diffusely.  He has pain with any type of active range of motion but passively go completely above his head without pain.  He has negative Hawkins negative Neer's.  Internal rotation is to L5 on the right 3 on the left.  His internal rotation, external elbow flexion, shoulder flow strength 5/5.  Upper extremity reflexes are 2+/4.  His sensation is intact light touch.  His manual muscle testing is 5/5 diffusely.  He has a negative Spurling's compression test and Lhermitte's compression  test.   ASSESSMENT  1. Acute pain of right shoulder   2. Rotator cuff syndrome of right shoulder   3. Osteoarthritis of spine with radiculopathy, cervical region      PROCEDURES:  None  PLAN:  Pertinent additional documentation may be included in corresponding procedure notes, imaging studies, problem based documentation and patient instructions.  No problem-specific Assessment & Plan notes found for this encounter. Symptoms are consistent with a C5 radiculitis.  He will hopefully respond well to gabapentinoids and steroid.  Discussed the option for referral to physical therapy and this BUN asked  him if he has any lack of improvement.  If any persistent ongoing nighttime disturbances we will plan to obtain MRI of the cervical spine to discuss possibility for epidural steroid injection. RICE (Rest, ICE, Compression, Elevation) principles reviewed with the patient.   Activity modifications and the importance of avoiding exacerbating activities (limiting pain to no more than a 4 / 10 during or following activity) recommended and discussed. Discussed red flag symptoms that warrant earlier emergent evaluation and patient voices understanding.  Meds ordered this encounter  Medications  . gabapentin (NEURONTIN) 300 MG capsule    Sig: Start with 1 tab po qhs X 1 week, then increase to 1 tab po bid X 1 week then 1 tab po tid prn    Dispense:  90 capsule    Refill:  1  . predniSONE (DELTASONE) 20 MG tablet    Sig: Take 3tabs PO QAM x3days, 2tabs PO QAM x3days, 1tab PO QAM x4days, 0.5tab PO QAM X4days    Dispense:  21 tablet    Refill:  0    Lab Orders  No laboratory test(s) ordered today   Imaging Orders  DG Cervical Spine 2 or 3 views  DG Shoulder Right  Referral Orders  No referral(s) requested today    At follow up will plan : initial osteopathic manipulation Return in about 4 weeks (around 03/10/2018).          Gerda Diss, [place] [facility] Physician

## 2018-02-13 ENCOUNTER — Ambulatory Visit: Payer: No Typology Code available for payment source | Admitting: Sports Medicine

## 2018-02-22 ENCOUNTER — Other Ambulatory Visit: Payer: Self-pay | Admitting: Internal Medicine

## 2018-02-28 ENCOUNTER — Other Ambulatory Visit: Payer: Self-pay

## 2018-03-01 MED ORDER — TRAMADOL HCL 50 MG PO TABS: 50.0000 mg | ORAL_TABLET | Freq: Four times a day (QID) | ORAL | 0 refills | Status: DC

## 2018-03-01 NOTE — Telephone Encounter (Signed)
Last filled 01-30-18 #120 Last OV/CPE Here 06-30-17 No Future OV Here [facility] [place]

## 2018-03-02 ENCOUNTER — Encounter: Payer: Self-pay | Admitting: Sports Medicine

## 2018-03-04 ENCOUNTER — Other Ambulatory Visit: Payer: Self-pay | Admitting: Sports Medicine

## 2018-03-10 ENCOUNTER — Ambulatory Visit: Payer: No Typology Code available for payment source | Admitting: Sports Medicine

## 2018-03-13 ENCOUNTER — Encounter: Payer: Self-pay | Admitting: Sports Medicine

## 2018-03-13 ENCOUNTER — Ambulatory Visit (INDEPENDENT_AMBULATORY_CARE_PROVIDER_SITE_OTHER): Payer: No Typology Code available for payment source | Admitting: Sports Medicine

## 2018-03-13 VITALS — BP 158/92 | HR 77 | Ht 74.0 in | Wt 284.2 lb

## 2018-03-13 DIAGNOSIS — M75101 Unspecified rotator cuff tear or rupture of right shoulder, not specified as traumatic: Secondary | ICD-10-CM

## 2018-03-13 DIAGNOSIS — M9908 Segmental and somatic dysfunction of rib cage: Secondary | ICD-10-CM | POA: Diagnosis not present

## 2018-03-13 DIAGNOSIS — M4722 Other spondylosis with radiculopathy, cervical region: Secondary | ICD-10-CM | POA: Diagnosis not present

## 2018-03-13 DIAGNOSIS — M9902 Segmental and somatic dysfunction of thoracic region: Secondary | ICD-10-CM

## 2018-03-13 DIAGNOSIS — M9901 Segmental and somatic dysfunction of cervical region: Secondary | ICD-10-CM | POA: Diagnosis not present

## 2018-03-13 NOTE — Patient Instructions (Signed)
Please perform the exercise program that we have prepared for you and gone over in detail on a daily basis.  In addition to the handout you were provided you can access your program through: [email or web address]   Your unique program code is:  [phone]   Also check out "Foundation Training" which is a program developed by Dr. Minerva Ends.   There are links to a couple of his YouTube Videos below and I would like to see you performing one of his videos 5-6 days per week.  It is best to do these exercises first thing in the morning.  They will give you a good jumpstart here today and start normalizing the way you move.  A good intro video is: "Independence from Pain 7-minute Video" - [email or web address]   A more advanced video is: Interior and spatial designer original 12 minutes" - [email or web address].king-[email or web address]/  Exercises that focus more on the neck are as below: Dr. Archie Balboa with [place] teaching neck and shoulder details Part 1 - https://youtu.be/[email or web address] Part 2 Dr. Archie Balboa with [facility] quick routine to practice daily - https://youtu.be/[email or web address]  Do not try to attempt the entire video when first beginning.  Try breaking of each exercise that he goes into shorter segments.  In other words, if they perform an exercise for 45 seconds, start with 15 seconds and rest and then resume when they begin the new activity.  If you work your way up to being able to do these videos without having to stop, I expect you will see significant improvements in your pain.  If you enjoy his videos and would like to find out more you can look on his website: [email or web address]  He has a workout streaming option as well as a DVD set available for purchase.  Amazon has the best price for his DVDs.

## 2018-03-13 NOTE — Progress Notes (Signed)
Lee Valenzuela. , [place] at [place] - 58 y.o. male MRN [id number]  Date of birth: Apr 01, 1960  Visit Date: March 13, 2018  PCP: Venia Carbon, MD   Referred by: Venia Carbon, MD  SUBJECTIVE:  Chief Complaint  Patient presents with  . Follow-up    neck, R shoulder and R arm pain.  Lots of meds.  HEP (scap stab and RC).    HPI: Patient is here for follow-up of right shoulder neck and arm pain.  Overall he is continued to have fairly significant symptoms with aggravating activities but day-to-day he is shown fairly moderate improvement with at least 25% improvement per his report.  Symptoms are worse with functional internal rotation reaching quickly is still exacerbating for him.  Lying flat and writing on his right side has been exacerbating for him as well as turning a key in a lock.  Prednisone taper did improve his symptoms and the gabapentin has also been official.  He is sleeping better.  Still having occasional symptoms especially while laying on the right side.  REVIEW OF SYSTEMS: Denies fevers, chills, recent weight gain or weight loss.  No night sweats.  Pt denies any change in bowel or bladder habits, muscle weakness, numbness or falls associated with this pain. Per HPI  HISTORY:  Prior history reviewed and updated per electronic medical record.  Patient Active Problem List   Diagnosis Date Noted  . Osteoarthritis of spine with radiculopathy, cervical region 02/10/2018    02/10/2018 XR C-spine IMPRESSION: 1. No fracture or acute finding.  No bone lesion. 2. Degenerative changes as described.   . Rotator cuff syndrome of right shoulder 02/10/2018    02/10/2018 XR R shoulder FINDINGS: No fracture or bone lesion. Glenohumeral AC joints are normally spaced and aligned. No significant arthropathic/degenerative change. Soft tissues are unremarkable.  C-spine XR - 02/10/18   .  Actinic keratosis 11/25/2014  . Asthma with acute exacerbation 05/19/2012  . Vitamin B12 deficiency 11/05/2011  . Routine general medical examination at a health care facility 08/07/2010  . Obstructive sleep apnea 08/07/2010    NPSG 08/2010:  AHI 31/hr autoset 12/2010: 17cm   . Mild persistent asthma in adult without complication 09/32/3557    Qualifier: Diagnosis of  By: Silvio Pate MD, [facility]    . GERD 10/05/2006    Qualifier: Diagnosis of  By: Silvio Pate MD, [facility]    . [place] DISEASE, LUMBAR 10/05/2006    Qualifier: Diagnosis of  By: Silvio Pate MD, [facility]     Social History   Occupational History  . Occupation: Probation officer    Comment:    Tobacco Use  . Smoking status: Never Smoker  . Smokeless tobacco: Never Used  Substance and Sexual Activity  . Alcohol use: Yes    Comment: OCC BEER  . Drug use: Not on file  . Sexual activity: Not on file   Social History   Social History Narrative  . Not on file    OBJECTIVE:  VS:  HT:6\' 2"  (188 cm)   WT:284 lb 3.2 oz (128.9 kg)  BMI:36.47    BP: (!) 158/92  HR:77bpm  TEMP: ( )  RESP:95 %   PHYSICAL EXAM: Adult male. No acute distress.  Alert and appropriate. He has full overhead range of motion of his bilateral shoulders.  His intrinsic shoulder strength is 5/5 with internal rotation, external  rotation, empty can testing, he has negative Spurling's compression test and Lhermitte's present test.  He has markedly tight anterior chain muscles with scapular dyskinesis with overhead reach.  Upper extremity sensation intact to touch.   ASSESSMENT:  1. Osteoarthritis of spine with radiculopathy, cervical region   2. Rotator cuff syndrome of right shoulder   3. Somatic dysfunction of cervical region   4. Somatic dysfunction of thoracic region   5. Somatic dysfunction of rib cage region     PROCEDURES:  PROCEDURE NOTE: THERAPEUTIC EXERCISES (01751)   Discussed the  foundation of treatment for this condition is physical therapy and/or daily (5-6 days/week) therapeutic exercises, focusing on core strengthening, coordination, neuromuscular control/reeducation. 15 minutes spent for Therapeutic exercises as below and as referenced in the AVS. This included exercises focusing on stretching, strengthening, with significant focus on eccentric aspects.  Proper technique shown and discussed handout in great detail with ATC. All questions were discussed and answered.   Long term goals include an improvement in range of motion, strength, endurance as well as avoiding reinjury. Frequency of visits is one time as determined during today's office visit. Frequency of exercises to be performed is as per handout.  EXERCISES REVIEWED:   Archie Balboa Exercises Cervical Towel Stretching Exercises   PROCEDURE NOTE: OSTEOPATHIC MANIPULATION   The decision today to treat with Osteopathic Manipulative Therapy (OMT) was based on physical exam findings. Verbal consent was obtained following a discussion with the patient regarding the of risks, benefits and potential side effects, including an acute pain flare,post manipulation soreness and need for repeat treatments. Additionally, we specifically discussed the minimal risk of  injury to neurovascular structures associated with Cervical manipulation.NONE  Manipulation was performed as below:  Regions Treated & Osteopathic Exam Findings   CERVICAL SPINE:   OA - rotated right C5 ERS LEFT (Extended, Rotated & Sidebent) THORACIC SPINE:    T2 - 4 Neutral, rotated RIGHT, sidebent LEFT T8 - 10 Neutral, rotated LEFT, sidebent RIGHT RIBS:   Rib 7Right  Posterior    OMT Techniques Used:   HVLA muscle energy myofascial release    The patient tolerated the treatment well and reported Improved symptoms following treatment today. Patient was given medications, exercises, stretches and lifestyle modifications per AVS and verbally.        PLAN:  Pertinent additional documentation may be included in corresponding procedure notes, imaging studies, problem based documentation and patient instructions.  No problem-specific Assessment & Plan notes found for this encounter.  Rotator cuff does seem to be improved.  He is having persistent anterior chain dominance and likely some brachial plexus compression/functional thoracic outlet syndrome.  Could consider referral to physical therapy but he travels quite often.  He did respond well to osteopathic manipulation.  Links to [employer] provided today per Patient Instructions.  These exercises were developed by Minerva Ends, DC with a strong emphasis on core neuromuscular reducation and postural realignment through body-weight exercises.   Home Therapeutic exercises prescribed today per procedure note. Continue previously prescribed home exercise program.   Osteopathic manipulation was performed today based on physical exam findings.  Patient was counseled on the purpose and expected outcome of osteopathic manipulation and understands that a single treatment may not provide permanent long lasting relief.  They understand that home therapeutic exercises are critical part of the healing/treatment process and will continue with self treatment between now and their next visit as outlined.  The patient understands that the frequency of visits is meant to provide  a stimulus to promote the body's own ability to heal and is not meant to be the sole means for improvement in their symptoms.  Activity modifications and the importance of avoiding exacerbating activities (limiting pain to no more than a 4 / 10 during or following activity) recommended and discussed.   Discussed red flag symptoms that warrant earlier emergent evaluation and patient voices understanding.    No orders of the defined types were placed in this encounter. Lab Orders  No laboratory test(s) ordered today    Imaging Orders  No imaging studies ordered today   Referral Orders  No referral(s) requested today      Return in about 4 weeks (around 04/10/2018) for consideration of repeat Osteopathic Manipulation.          Gerda Diss, [place] [facility] Physician

## 2018-03-15 ENCOUNTER — Encounter: Payer: Self-pay | Admitting: Sports Medicine

## 2018-03-15 DIAGNOSIS — M4722 Other spondylosis with radiculopathy, cervical region: Secondary | ICD-10-CM

## 2018-03-16 MED ORDER — METHYLPREDNISOLONE 4 MG PO TBPK: ORAL_TABLET | ORAL | 0 refills | Status: DC

## 2018-03-27 ENCOUNTER — Ambulatory Visit
Admission: RE | Admit: 2018-03-27 | Discharge: 2018-03-27 | Disposition: A | Discharge status: Home / Self Care | Payer: No Typology Code available for payment source | Source: Ambulatory Visit | Attending: Sports Medicine | Admitting: Sports Medicine

## 2018-03-27 DIAGNOSIS — M4722 Other spondylosis with radiculopathy, cervical region: Secondary | ICD-10-CM

## 2018-03-29 ENCOUNTER — Other Ambulatory Visit: Payer: Self-pay

## 2018-03-29 MED ORDER — TRAMADOL HCL 50 MG PO TABS: 50.0000 mg | ORAL_TABLET | Freq: Four times a day (QID) | ORAL | 0 refills | Status: DC

## 2018-03-29 NOTE — Telephone Encounter (Signed)
Last filled 03-01-18 #120 Last OV/CPE 06-30-17 No Future OV [facility]  Forward to [facility] in Dr Alla German absence

## 2018-04-06 ENCOUNTER — Telehealth: Payer: Self-pay | Admitting: Sports Medicine

## 2018-04-06 NOTE — Telephone Encounter (Signed)
See note  Copied from [place] [phone]. Topic: Quick Communication - Other Results (Clinic Use ONLY) >> Apr 06, 2018  2:26 PM Scherrie Gerlach wrote: Wife (is on DPR) calling to get results of MRI done 03/27/2018. Ordered by Dr Paulla Fore.  Pt has not heard anything back and it has been over a week. Please call Arbie Cookey

## 2018-04-06 NOTE — Telephone Encounter (Signed)
Called pt and he states that he has a f/u appt already scheduled for Monday.  I inform him that Dr. Paulla Fore will go over his results at that time as well as discussing treatment options going forward.

## 2018-04-10 ENCOUNTER — Ambulatory Visit: Payer: No Typology Code available for payment source | Admitting: Sports Medicine

## 2018-04-10 ENCOUNTER — Telehealth: Payer: Self-pay | Admitting: Sports Medicine

## 2018-04-10 DIAGNOSIS — M4722 Other spondylosis with radiculopathy, cervical region: Secondary | ICD-10-CM

## 2018-04-10 NOTE — Telephone Encounter (Signed)
Forwarding to Dr. Rigby.  

## 2018-04-10 NOTE — Telephone Encounter (Signed)
See note  Copied from [place] [phone]. Topic: General - Other >> Apr 10, 2018  8:34 AM Virl Axe D wrote: Reason for CRM: Pt stated due to his asthma history his family prefers he not come into the office to receive his MRI results but rather Dr. Paulla Fore give them over the phone or via [place]. Please advise. [phone]

## 2018-04-11 MED ORDER — PREDNISONE 20 MG PO TABS: ORAL_TABLET | ORAL | 0 refills | Status: DC

## 2018-04-11 NOTE — Addendum Note (Signed)
Addended by: Teresa Coombs D on: 04/11/2018 04:13 PM   Modules accepted: Orders

## 2018-04-11 NOTE — Telephone Encounter (Signed)
Discussed results with patient.   Rx for longer taper...  Will Need referal to [facility] for [facility] pending COVID-19 restrictions.

## 2018-04-13 NOTE — Progress Notes (Signed)
Discussed with patient over phone.  See phone note

## 2018-04-26 ENCOUNTER — Other Ambulatory Visit: Payer: Self-pay | Admitting: Internal Medicine

## 2018-04-26 MED ORDER — TRAMADOL HCL 50 MG PO TABS: 50.0000 mg | ORAL_TABLET | Freq: Four times a day (QID) | ORAL | 0 refills | Status: DC

## 2018-04-26 NOTE — Telephone Encounter (Signed)
Last filled 03-29-18 #120 Last OV 03-13-18 Next OV 05-16-18 [facility]

## 2018-04-27 ENCOUNTER — Telehealth (INDEPENDENT_AMBULATORY_CARE_PROVIDER_SITE_OTHER): Payer: Self-pay | Admitting: *Deleted

## 2018-04-27 NOTE — Telephone Encounter (Signed)
Submitted PA via fax to [phone]

## 2018-05-02 ENCOUNTER — Telehealth (INDEPENDENT_AMBULATORY_CARE_PROVIDER_SITE_OTHER): Payer: Self-pay | Admitting: *Deleted

## 2018-05-03 ENCOUNTER — Encounter: Payer: Self-pay | Admitting: Sports Medicine

## 2018-05-11 ENCOUNTER — Encounter (INDEPENDENT_AMBULATORY_CARE_PROVIDER_SITE_OTHER): Payer: Self-pay | Admitting: Physical Medicine and Rehabilitation

## 2018-05-16 ENCOUNTER — Encounter (INDEPENDENT_AMBULATORY_CARE_PROVIDER_SITE_OTHER): Payer: Self-pay | Admitting: Physical Medicine and Rehabilitation

## 2018-05-17 ENCOUNTER — Other Ambulatory Visit: Payer: Self-pay | Admitting: Physical Therapy

## 2018-05-17 ENCOUNTER — Encounter: Payer: Self-pay | Admitting: Sports Medicine

## 2018-05-17 DIAGNOSIS — M4722 Other spondylosis with radiculopathy, cervical region: Secondary | ICD-10-CM

## 2018-05-22 ENCOUNTER — Other Ambulatory Visit: Payer: Self-pay | Admitting: Internal Medicine

## 2018-05-30 ENCOUNTER — Other Ambulatory Visit: Payer: Self-pay

## 2018-05-30 MED ORDER — TRAMADOL HCL 50 MG PO TABS: 50.0000 mg | ORAL_TABLET | Freq: Four times a day (QID) | ORAL | 0 refills | Status: DC

## 2018-05-30 NOTE — Telephone Encounter (Signed)
I have approved this but am having trouble with my electronic prescribing See if they can take a verbal (I don't think so) I will try rebooting my whole system just in case

## 2018-05-30 NOTE — Telephone Encounter (Signed)
Last filled 04-26-18 #120 Last OV 03-13-18 Next OV 06-07-18 [facility]

## 2018-05-30 NOTE — Telephone Encounter (Signed)
Tried Impriv[employer] again and it worked this time

## 2018-05-30 NOTE — Telephone Encounter (Signed)
Left refill on Vm at pharmacy.

## 2018-06-07 ENCOUNTER — Encounter: Payer: Self-pay | Admitting: Physical Medicine and Rehabilitation

## 2018-06-13 ENCOUNTER — Other Ambulatory Visit: Payer: Self-pay | Admitting: Internal Medicine

## 2018-06-27 ENCOUNTER — Other Ambulatory Visit: Payer: Self-pay

## 2018-06-27 MED ORDER — TRAMADOL HCL 50 MG PO TABS: 50.0000 mg | ORAL_TABLET | Freq: Four times a day (QID) | ORAL | 0 refills | Status: DC

## 2018-06-27 NOTE — Telephone Encounter (Signed)
[place] filled 05-30-18 #120 Last OV 06-30-17 No Future OV [facility]

## 2018-07-15 ENCOUNTER — Other Ambulatory Visit: Payer: Self-pay | Admitting: Internal Medicine

## 2018-07-25 ENCOUNTER — Other Ambulatory Visit: Payer: Self-pay

## 2018-07-25 MED ORDER — TRAMADOL HCL 50 MG PO TABS: 50.0000 mg | ORAL_TABLET | Freq: Four times a day (QID) | ORAL | 0 refills | Status: DC

## 2018-07-25 NOTE — Telephone Encounter (Signed)
Spoke to pt. Made appt tomorrow at 745 am

## 2018-07-25 NOTE — Telephone Encounter (Signed)
Last filled 06-27-18 #120 Last OV 06-30-17 No Future OV [facility]

## 2018-07-25 NOTE — Telephone Encounter (Signed)
Needs PE before next refill See if he wants our spots tomorrow morning or another early AM opening

## 2018-07-26 ENCOUNTER — Encounter: Payer: Self-pay | Admitting: Internal Medicine

## 2018-07-26 ENCOUNTER — Other Ambulatory Visit: Payer: Self-pay

## 2018-07-26 ENCOUNTER — Ambulatory Visit (INDEPENDENT_AMBULATORY_CARE_PROVIDER_SITE_OTHER): Payer: No Typology Code available for payment source | Admitting: Internal Medicine

## 2018-07-26 VITALS — BP 140/90 | HR 68 | Temp 98.1°F | Ht 74.0 in | Wt 287.0 lb

## 2018-07-26 DIAGNOSIS — M4722 Other spondylosis with radiculopathy, cervical region: Secondary | ICD-10-CM

## 2018-07-26 DIAGNOSIS — Z0000 Encounter for general adult medical examination without abnormal findings: Secondary | ICD-10-CM

## 2018-07-26 DIAGNOSIS — J4530 Mild persistent asthma, uncomplicated: Secondary | ICD-10-CM | POA: Diagnosis not present

## 2018-07-26 DIAGNOSIS — E538 Deficiency of other specified B group vitamins: Secondary | ICD-10-CM

## 2018-07-26 DIAGNOSIS — G4733 Obstructive sleep apnea (adult) (pediatric): Secondary | ICD-10-CM

## 2018-07-26 LAB — COMPREHENSIVE METABOLIC PANEL
ALT: 24 U/L (ref 0–53)
AST: 20 U/L (ref 0–37)
Albumin: 4.4 g/dL (ref 3.5–5.2)
Alkaline Phosphatase: 50 U/L (ref 39–117)
BUN: 13 mg/dL (ref 6–23)
CO2: 28 mEq/L (ref 19–32)
Calcium: 9.2 mg/dL (ref 8.4–10.5)
Chloride: 103 mEq/L (ref 96–112)
Creatinine, Ser: 0.97 mg/dL (ref 0.40–1.50)
GFR: 79.92 mL/min (ref >60.00)
Glucose, Bld: 99 mg/dL (ref 70–99)
Potassium: 4.2 mEq/L (ref 3.5–5.1)
Sodium: 139 mEq/L (ref 135–145)
Total Bilirubin: 0.6 mg/dL (ref 0.2–1.2)
Total Protein: 6.9 g/dL (ref 6.0–8.3)

## 2018-07-26 LAB — CBC
HCT: 41.4 % (ref 39.0–52.0)
Hemoglobin: 14.1 g/dL (ref 13.0–17.0)
MCHC: 34.1 g/dL (ref 30.0–36.0)
MCV: 86.4 fl (ref 78.0–100.0)
Platelets: 225 10*3/uL (ref 150.0–400.0)
RBC: 4.79 Mil/uL (ref 4.22–5.81)
RDW: 13.1 % (ref 11.5–15.5)
WBC: 5.1 10*3/uL (ref 4.0–10.5)

## 2018-07-26 LAB — VITAMIN B12: Vitamin B-12: 164 pg/mL — ABNORMAL LOW (ref 211–911)

## 2018-07-26 NOTE — Progress Notes (Signed)
Subjective:    Patient ID: Lee Valenzuela, male    DOB: December 18, 1960, 58 y.o.   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 Still essential employee---works at home when not on site  Had seen the sports medicine doctor Right shoulder is bad still---got steroid injection into neck and it helped (cervical radiculopathy apparently) Still has some pain there----and radiating pain into both fingers  Gained a lot of weight while on prednisone Now has ski machine to start  Asthma has been quiet He thinks the masks have actually helped Only occasional albuterol use---like before mowing Had been on advair in the past--but too expensive,etc  Hasn't been taking separate B12 lately  Current Outpatient Medications on File Prior to Visit  Medication Sig Dispense Refill  . albuterol (PROVENTIL) (2.5 MG/3ML) 0.083% nebulizer solution USE 1 VIAL IN NEBULIZER AS DIRECTED 75 mL 3  . famotidine (PEPCID) 20 MG tablet Take 20 mg by mouth daily.    Marland Kitchen ibuprofen (ADVIL,MOTRIN) 200 MG tablet Take 200 mg by mouth as needed. For pain     . Multiple Vitamin (MULTIVITAMIN) capsule Take 1 capsule by mouth daily.    . traMADol (ULTRAM) 50 MG tablet Take 1 tablet (50 mg total) by mouth 4 (four) times daily. 120 tablet 0  . VENTOLIN HFA 108 (90 Base) MCG/ACT inhaler INHALE 2 PUFFS BY MOUTH EVERY 6 HOURS AS NEEDED FOR WHEEZING 18 g 0   No current facility-administered medications on file prior to visit.     Allergies  Allergen Reactions  . Ciprofloxacin     REACTION: ? Rash    Past Medical History:  Diagnosis Date  . Anxiety    with panic  . Asthma   . Gastroenteritis 6/02  . GERD (gastroesophageal reflux disease)   . Hyperlipidemia   . Lumbar disc disease   . Pneumonia 1995  . Sleep apnea    USES CPAP    Past Surgical History:  Procedure Laterality Date  . KNEE ARTHROSCOPY  2/12   L knee  . LAMINECTOMY  1997  . LUMBAR SPINE SURGERY  2007   x 2,  fusion  . MENISCUS REPAIR  2/12   [employer]  . SPINAL FUSION      Family History  Problem Relation Age of Onset  . Ovarian cancer Paternal Grandmother   . Liver cancer Maternal Grandfather   . Depression Father        nervous breakdown  . Hypertension Father   . Ulcers Father   . Cancer Father        dying of myelodysplasia  . Bipolar disorder Son   . Schizophrenia Son   . Other Sister        nervous breakdown  . Asthma Sister   . Allergies Other        In family  . Coronary artery disease Neg Hx   . Diabetes Neg Hx   . Migraines Neg Hx   . Colon cancer Neg Hx   . Esophageal cancer Neg Hx   . Rectal cancer Neg Hx   . Stomach cancer Neg Hx     Social History   Socioeconomic History  . Marital status: Married    Spouse name: Not on file  . Number of children: 3  . Years of education: Not on file  . Highest education level: Not on file  Occupational History  . Occupation: Airline pilot and  salesman    Comment:    Social Needs  . Financial resource strain: Not on file  . Food insecurity    Worry: Not on file    Inability: Not on file  . Transportation needs    Medical: Not on file    Non-medical: Not on file  Tobacco Use  . Smoking status: Never Smoker  . Smokeless tobacco: Never Used  Substance and Sexual Activity  . Alcohol use: Yes    Comment: OCC BEER  . Drug use: Not on file  . Sexual activity: Not on file  Lifestyle  . Physical activity    Days per week: Not on file    Minutes per session: Not on file  . Stress: Not on file  Relationships  . Social Herbalist on phone: Not on file    Gets together: Not on file    Attends religious service: Not on file    Active member of club or organization: Not on file    Attends meetings of clubs or organizations: Not on file    Relationship status: Not on file  . Intimate partner violence    Fear of current or ex partner: Not on file    Emotionally abused:  Not on file    Physically abused: Not on file    Forced sexual activity: Not on file  Other Topics Concern  . Not on file  Social History Narrative  . Not on file   Review of Systems  Constitutional: Positive for unexpected weight change. Negative for fatigue.       Wears seat belt  HENT: Negative for hearing loss and tinnitus.        Recent tooth repair---keeps up with dentist  Eyes: Negative for visual disturbance.       No diplopia or unilateral vision loss  Respiratory: Negative for shortness of breath.        Intermittent tightness and wheezing occasionally  Cardiovascular: Negative for chest pain, palpitations and leg swelling.  Gastrointestinal: Negative for blood in stool and constipation.       No heartburn lately--rarely needs med  Endocrine: Negative for polydipsia and polyuria.  Genitourinary: Negative for difficulty urinating and urgency.       No sexual problems  Musculoskeletal: Positive for arthralgias and back pain. Negative for joint swelling.  Skin: Negative for rash.       Chronic back lesions  Allergic/Immunologic: Negative for environmental allergies and immunocompromised state.  Neurological: Negative for dizziness, syncope, light-headedness and headaches.  Hematological: Negative for adenopathy. Does not bruise/bleed easily.  Psychiatric/Behavioral: Negative for dysphoric mood.       Sleeps well with the CPAP every night Anxious at times---nothing persistent (work related)       Objective:   Physical Exam  Constitutional: He is oriented to person, place, and time. He appears well-developed. No distress.  HENT:  Head: Normocephalic and atraumatic.  Right Ear: External ear normal.  Left Ear: External ear normal.  Mouth/Throat: Oropharynx is clear and moist. No oropharyngeal exudate.  Eyes: Pupils are equal, round, and reactive to light. Conjunctivae are normal.  Neck: No thyromegaly present.  Cardiovascular: Normal rate, regular rhythm, normal heart  sounds and intact distal pulses. Exam reveals no gallop.  No murmur heard. Respiratory: Effort normal and breath sounds normal. No respiratory distress. He has no wheezes. He has no rales.  GI: Soft. There is no abdominal tenderness.  Musculoskeletal:        General: No  tenderness or edema.  Lymphadenopathy:    He has no cervical adenopathy.  Neurological: He is alert and oriented to person, place, and time.  Skin: No rash noted. No erythema.  Small seb keratoses across back  Psychiatric: He has a normal mood and affect. His behavior is normal.           Assessment & Plan:

## 2018-07-26 NOTE — Patient Instructions (Addendum)
Please set up your colonoscopy as soon as you work out Art therapist.  DASH Eating Plan DASH stands for "Dietary Approaches to Stop Hypertension." The DASH eating plan is a healthy eating plan that has been shown to reduce high blood pressure (hypertension). It may also reduce your risk for type 2 diabetes, heart disease, and stroke. The DASH eating plan may also help with weight loss. What are tips for following this plan?  General guidelines  Avoid eating more than 2,300 mg (milligrams) of salt (sodium) a day. If you have hypertension, you may need to reduce your sodium intake to 1,500 mg a day.  Limit alcohol intake to no more than 1 drink a day for nonpregnant women and 2 drinks a day for men. One drink equals 12 oz of beer, 5 oz of wine, or 1 oz of hard liquor.  Work with your health care provider to maintain a healthy body weight or to lose weight. Ask what an ideal weight is for you.  Get at least 30 minutes of exercise that causes your heart to beat faster (aerobic exercise) most days of the week. Activities may include walking, swimming, or biking.  Work with your health care provider or diet and nutrition specialist (dietitian) to adjust your eating plan to your individual calorie needs. Reading food labels   Check food labels for the amount of sodium per serving. Choose foods with less than 5 percent of the Daily Value of sodium. Generally, foods with less than 300 mg of sodium per serving fit into this eating plan.  To find whole grains, look for the word "whole" as the first word in the ingredient list. Shopping  Buy products labeled as "low-sodium" or "no salt added."  Buy fresh foods. Avoid canned foods and premade or frozen meals. Cooking  Avoid adding salt when cooking. Use salt-free seasonings or herbs instead of table salt or sea salt. Check with your health care provider or pharmacist before using salt substitutes.  Do not fry foods. Cook foods using healthy  methods such as baking, boiling, grilling, and broiling instead.  Cook with heart-healthy oils, such as olive, canola, soybean, or sunflower oil. Meal planning  Eat a balanced diet that includes: ? 5 or more servings of fruits and vegetables each day. At each meal, try to fill half of your plate with fruits and vegetables. ? Up to 6-8 servings of whole grains each day. ? Less than 6 oz of lean meat, poultry, or fish each day. A 3-oz serving of meat is about the same size as a deck of cards. One egg equals 1 oz. ? 2 servings of low-fat dairy each day. ? A serving of nuts, seeds, or beans 5 times each week. ? Heart-healthy fats. Healthy fats called Omega-3 fatty acids are found in foods such as flaxseeds and coldwater fish, like sardines, salmon, and mackerel.  Limit how much you eat of the following: ? Canned or prepackaged foods. ? Food that is high in trans fat, such as fried foods. ? Food that is high in saturated fat, such as fatty meat. ? Sweets, desserts, sugary drinks, and other foods with added sugar. ? Full-fat dairy products.  Do not salt foods before eating.  Try to eat at least 2 vegetarian meals each week.  Eat more home-cooked food and less restaurant, buffet, and fast food.  When eating at a restaurant, ask that your food be prepared with less salt or no salt, if possible. What foods are recommended?  The items listed may not be a complete list. Talk with your dietitian about what dietary choices are best for you. Grains Whole-grain or whole-wheat bread. Whole-grain or whole-wheat pasta. Brown rice. Modena Morrow. Bulgur. Whole-grain and low-sodium cereals. Pita bread. Low-fat, low-sodium crackers. Whole-wheat flour tortillas. Vegetables Fresh or frozen vegetables (raw, steamed, roasted, or grilled). Low-sodium or reduced-sodium tomato and vegetable juice. Low-sodium or reduced-sodium tomato sauce and tomato paste. Low-sodium or reduced-sodium canned  vegetables. Fruits All fresh, dried, or frozen fruit. Canned fruit in natural juice (without added sugar). Meat and other protein foods Skinless chicken or [place]. Ground chicken or [place]. Pork with fat trimmed off. Fish and seafood. Egg whites. Dried beans, peas, or lentils. Unsalted nuts, nut butters, and seeds. Unsalted canned beans. Lean cuts of beef with fat trimmed off. Low-sodium, lean deli meat. Dairy Low-fat (1%) or fat-free (skim) milk. Fat-free, low-fat, or reduced-fat cheeses. Nonfat, low-sodium ricotta or cottage cheese. Low-fat or nonfat yogurt. Low-fat, low-sodium cheese. Fats and oils Soft margarine without trans fats. Vegetable oil. Low-fat, reduced-fat, or light mayonnaise and salad dressings (reduced-sodium). Canola, safflower, olive, soybean, and sunflower oils. Avocado. Seasoning and other foods Herbs. Spices. Seasoning mixes without salt. Unsalted popcorn and pretzels. Fat-free sweets. What foods are not recommended? The items listed may not be a complete list. Talk with your dietitian about what dietary choices are best for you. Grains Baked goods made with fat, such as croissants, muffins, or some breads. Dry pasta or rice meal packs. Vegetables Creamed or fried vegetables. Vegetables in a cheese sauce. Regular canned vegetables (not low-sodium or reduced-sodium). Regular canned tomato sauce and paste (not low-sodium or reduced-sodium). Regular tomato and vegetable juice (not low-sodium or reduced-sodium). Angie Fava. Olives. Fruits Canned fruit in a light or heavy syrup. Fried fruit. Fruit in cream or butter sauce. Meat and other protein foods Fatty cuts of meat. Ribs. Fried meat. Berniece Salines. Sausage. Bologna and other processed lunch meats. Salami. Fatback. Hotdogs. Bratwurst. Salted nuts and seeds. Canned beans with added salt. Canned or smoked fish. Whole eggs or egg yolks. Chicken or [place] with skin. Dairy Whole or 2% milk, cream, and half-and-half. Whole or full-fat  cream cheese. Whole-fat or sweetened yogurt. Full-fat cheese. Nondairy creamers. Whipped toppings. Processed cheese and cheese spreads. Fats and oils Butter. Stick margarine. Lard. Shortening. Ghee. Bacon fat. Tropical oils, such as coconut, palm kernel, or palm oil. Seasoning and other foods Salted popcorn and pretzels. Onion salt, garlic salt, seasoned salt, table salt, and sea salt. Worcestershire sauce. Tartar sauce. Barbecue sauce. Teriyaki sauce. Soy sauce, including reduced-sodium. Steak sauce. Canned and packaged gravies. Fish sauce. Oyster sauce. Cocktail sauce. Horseradish that you find on the shelf. Ketchup. Mustard. Meat flavorings and tenderizers. Bouillon cubes. Hot sauce and Tabasco sauce. Premade or packaged marinades. Premade or packaged taco seasonings. Relishes. Regular salad dressings. Where to find more information:  National Heart, Lung, and [place]: https://wilson-eaton.com/  American Heart Association: www.heart.org Summary  The DASH eating plan is a healthy eating plan that has been shown to reduce high blood pressure (hypertension). It may also reduce your risk for type 2 diabetes, heart disease, and stroke.  With the DASH eating plan, you should limit salt (sodium) intake to 2,300 mg a day. If you have hypertension, you may need to reduce your sodium intake to 1,500 mg a day.  When on the DASH eating plan, aim to eat more fresh fruits and vegetables, whole grains, lean proteins, low-fat dairy, and heart-healthy fats.  Work with your health care  provider or diet and nutrition specialist (dietitian) to adjust your eating plan to your individual calorie needs. This information is not intended to replace advice given to you by your health care provider. Make sure you discuss any questions you have with your health care provider. Document Released: 12/31/2010 Document Revised: 12/24/2016 Document Reviewed: 01/05/2016 Elsevier Patient Education  2020 [employer].

## 2018-07-26 NOTE — Assessment & Plan Note (Signed)
Doing okay with intermittent albuterol inhaler

## 2018-07-26 NOTE — Assessment & Plan Note (Signed)
Healthy but really out of shape--discussed eating and exercise Overdue for colon---he is waiting for better insurance later this year Flu vaccine in fall Discussed PSA--prefers to wait

## 2018-07-26 NOTE — Assessment & Plan Note (Signed)
Will recheck level

## 2018-07-26 NOTE — Assessment & Plan Note (Signed)
Uses the CPAP

## 2018-07-26 NOTE — Assessment & Plan Note (Signed)
Did better since [facility] Ongoing evaluations

## 2018-07-27 ENCOUNTER — Other Ambulatory Visit: Payer: Self-pay | Admitting: Internal Medicine

## 2018-07-27 DIAGNOSIS — E538 Deficiency of other specified B group vitamins: Secondary | ICD-10-CM

## 2018-08-23 ENCOUNTER — Other Ambulatory Visit: Payer: Self-pay

## 2018-08-23 ENCOUNTER — Other Ambulatory Visit: Payer: Self-pay | Admitting: Internal Medicine

## 2018-08-23 MED ORDER — TRAMADOL HCL 50 MG PO TABS: 50.0000 mg | ORAL_TABLET | Freq: Four times a day (QID) | ORAL | 0 refills | Status: DC

## 2018-08-23 NOTE — Telephone Encounter (Signed)
Pt notified that tramadol was sent electronically to [facility]. Pt voiced understanding,.

## 2018-08-23 NOTE — Telephone Encounter (Signed)
Name of Medication: tramadol 50 mg Name of Pharmacy: [facility] or Written Date and Quantity:# 120 on 07/25/18  Last Office Visit and Type:annual on 07/26/18  Next Office Visit and Type: 07/31/19 CPX Last Controlled Substance Agreement Date: none Last UDS:06/30/2017

## 2018-08-30 ENCOUNTER — Other Ambulatory Visit: Payer: Self-pay

## 2018-08-30 ENCOUNTER — Other Ambulatory Visit (INDEPENDENT_AMBULATORY_CARE_PROVIDER_SITE_OTHER): Payer: No Typology Code available for payment source

## 2018-08-30 DIAGNOSIS — E538 Deficiency of other specified B group vitamins: Secondary | ICD-10-CM

## 2018-08-30 LAB — VITAMIN B12: Vitamin B-12: 1125 pg/mL — ABNORMAL HIGH (ref 211–911)

## 2018-09-21 ENCOUNTER — Other Ambulatory Visit: Payer: Self-pay | Admitting: Internal Medicine

## 2018-09-22 MED ORDER — TRAMADOL HCL 50 MG PO TABS: 50.0000 mg | ORAL_TABLET | Freq: Four times a day (QID) | ORAL | 0 refills | Status: DC

## 2018-09-22 NOTE — Telephone Encounter (Signed)
Last filled 08/23/2018 #120 Last OV 07/26/2018 CPE [facility]

## 2018-10-21 ENCOUNTER — Other Ambulatory Visit: Payer: Self-pay

## 2018-10-23 MED ORDER — TRAMADOL HCL 50 MG PO TABS: 50.0000 mg | ORAL_TABLET | Freq: Four times a day (QID) | ORAL | 0 refills | Status: DC

## 2018-10-23 NOTE — Telephone Encounter (Signed)
Last filled 09-22-18 #120 Last OV 07-26-18 Next OV 07-31-19 [facility]

## 2018-11-20 ENCOUNTER — Other Ambulatory Visit: Payer: Self-pay

## 2018-11-21 MED ORDER — TRAMADOL HCL 50 MG PO TABS: 50.0000 mg | ORAL_TABLET | Freq: Four times a day (QID) | ORAL | 0 refills | Status: DC

## 2018-11-21 NOTE — Telephone Encounter (Signed)
Last filled 10-23-18 #120 Last OV 07-26-18 Next OV 07-31-19 [facility]

## 2018-12-18 ENCOUNTER — Other Ambulatory Visit: Payer: Self-pay

## 2018-12-18 MED ORDER — TRAMADOL HCL 50 MG PO TABS: 50.0000 mg | ORAL_TABLET | Freq: Four times a day (QID) | ORAL | 0 refills | Status: DC

## 2018-12-18 NOTE — Telephone Encounter (Signed)
Last OV 07-26-18 Next OV 07-31-19 [facility]  Last filled on 11/21/2018 #120 with 0 refill

## 2018-12-18 NOTE — Telephone Encounter (Deleted)
Electronic refill request. Tramadol Last office visit:    

## 2019-01-17 ENCOUNTER — Other Ambulatory Visit: Payer: Self-pay

## 2019-01-17 MED ORDER — TRAMADOL HCL 50 MG PO TABS: 50.0000 mg | ORAL_TABLET | Freq: Four times a day (QID) | ORAL | 0 refills | Status: DC

## 2019-01-17 NOTE — Telephone Encounter (Signed)
Last filled 12-18-18 #120 Last OV 07-26-18 Next OV 07-31-19 [facility]

## 2019-01-30 ENCOUNTER — Other Ambulatory Visit: Payer: Self-pay | Admitting: Internal Medicine

## 2019-02-16 ENCOUNTER — Other Ambulatory Visit: Payer: Self-pay

## 2019-02-16 MED ORDER — TRAMADOL HCL 50 MG PO TABS: 50.0000 mg | ORAL_TABLET | Freq: Four times a day (QID) | ORAL | 0 refills | Status: DC

## 2019-02-16 NOTE — Telephone Encounter (Signed)
Please call patient and let him know that Dr. Silvio Pate is out of the office. I have sent in a 7 day supply of his pain medication to last him until Dr. Silvio Pate has returned.

## 2019-02-16 NOTE — Telephone Encounter (Signed)
Last filled on 01/17/2019 #120 0 refill LOV 07/26/2018 CPE  Next appointment on 07/31/2019 CPE  Dr Alla German patient.

## 2019-02-19 ENCOUNTER — Other Ambulatory Visit: Payer: Self-pay

## 2019-02-19 MED ORDER — TRAMADOL HCL 50 MG PO TABS: 50.0000 mg | ORAL_TABLET | Freq: Four times a day (QID) | ORAL | 0 refills | Status: DC

## 2019-02-19 NOTE — Telephone Encounter (Signed)
The original Rx was filled 01/17/2019 #120, looks like Carlean Purl only filled 7 day supply on 02/16/2019... I have ordered the full Rx of #120 and will attach note to pharmacy to be filled on or after 02/23/2019 so pt can have full Rx for 1 month and will not cause issues with the next refill in a month.Marland KitchenMarland Kitchen

## 2019-03-23 ENCOUNTER — Other Ambulatory Visit: Payer: Self-pay

## 2019-03-23 NOTE — Telephone Encounter (Signed)
Last filled 125-21 #120 Last OV 07-26-18 Next OV 07-31-19 [facility]

## 2019-03-24 MED ORDER — TRAMADOL HCL 50 MG PO TABS: 50.0000 mg | ORAL_TABLET | Freq: Four times a day (QID) | ORAL | 0 refills | Status: DC

## 2019-04-23 ENCOUNTER — Other Ambulatory Visit: Payer: Self-pay

## 2019-04-24 MED ORDER — TRAMADOL HCL 50 MG PO TABS: 50.0000 mg | ORAL_TABLET | Freq: Four times a day (QID) | ORAL | 0 refills | Status: DC

## 2019-04-24 NOTE — Telephone Encounter (Signed)
Last filled 03-24-19 #120 Last OV 07-26-18 Next OV 07-31-19 [facility]

## 2019-05-23 ENCOUNTER — Other Ambulatory Visit: Payer: Self-pay

## 2019-05-23 MED ORDER — TRAMADOL HCL 50 MG PO TABS: 50.0000 mg | ORAL_TABLET | Freq: Four times a day (QID) | ORAL | 0 refills | Status: DC

## 2019-05-23 NOTE — Telephone Encounter (Signed)
Last filled 04-24-19 #120 Last OV 07-26-18 Next OV 07-31-19 [facility]

## 2019-06-19 ENCOUNTER — Other Ambulatory Visit: Payer: Self-pay

## 2019-06-20 MED ORDER — TRAMADOL HCL 50 MG PO TABS: 50.0000 mg | ORAL_TABLET | Freq: Four times a day (QID) | ORAL | 0 refills | Status: DC

## 2019-06-20 NOTE — Telephone Encounter (Signed)
Last filled 05-23-19 #120 Last OV 07-26-18 Next OV 07-31-19 [facility]

## 2019-07-19 ENCOUNTER — Other Ambulatory Visit: Payer: Self-pay

## 2019-07-19 MED ORDER — TRAMADOL HCL 50 MG PO TABS: 50.0000 mg | ORAL_TABLET | Freq: Four times a day (QID) | ORAL | 0 refills | Status: DC

## 2019-07-19 MED ORDER — ALBUTEROL SULFATE HFA 108 (90 BASE) MCG/ACT IN AERS: 2.0000 | INHALATION_SPRAY | Freq: Four times a day (QID) | RESPIRATORY_TRACT | 0 refills | Status: DC | PRN

## 2019-07-19 NOTE — Telephone Encounter (Signed)
Last filled 06-20-19 #120 Last OV 07-26-18 Next OV 07-31-19 [facility]

## 2019-07-31 ENCOUNTER — Encounter: Payer: No Typology Code available for payment source | Admitting: Internal Medicine

## 2019-08-16 ENCOUNTER — Encounter: Payer: Self-pay | Admitting: Family Medicine

## 2019-08-16 ENCOUNTER — Other Ambulatory Visit: Payer: Self-pay

## 2019-08-16 ENCOUNTER — Ambulatory Visit (INDEPENDENT_AMBULATORY_CARE_PROVIDER_SITE_OTHER): Payer: PRIVATE HEALTH INSURANCE | Admitting: Family Medicine

## 2019-08-16 VITALS — BP 152/82 | HR 73 | Temp 98.7°F | Ht 74.0 in | Wt 277.0 lb

## 2019-08-16 DIAGNOSIS — I10 Essential (primary) hypertension: Secondary | ICD-10-CM | POA: Diagnosis not present

## 2019-08-16 DIAGNOSIS — R42 Dizziness and giddiness: Secondary | ICD-10-CM

## 2019-08-16 MED ORDER — MECLIZINE HCL 25 MG PO TABS: 25.0000 mg | ORAL_TABLET | Freq: Three times a day (TID) | ORAL | 0 refills | Status: AC | PRN

## 2019-08-16 MED ORDER — LOSARTAN POTASSIUM 50 MG PO TABS: 25.0000 mg | ORAL_TABLET | Freq: Every day | ORAL | 11 refills | Status: DC

## 2019-08-16 NOTE — Progress Notes (Signed)
Chief Complaint  Patient presents with  . Dizziness    Seen at [place] ED yesterday    History of Present Illness: HPI   59 year old patient of Dr. Alla German with history of anxiety presents for ER follow up.  Seen at [facility] ER on 08/15/2019 for headache for 2 week, started vertigo yesterday noted to have elevated BP.  Note and imaging reviewed in detail BP was 216/111,  206/97, Hr75 EKG showed normal sinus rhythm at 73 bpm. Minimal voltage criteria for LVH. Left axis deviation. QTc 438 ms. ST segments out evidence of acute ischemia.  CT head negative for acute process. CBC, CMP, troponin unremarkable. Urinalysis is unremarkable as well., EKG, UA, and basic labs. Given Reglan for headache and meclizine for vertigo. Started on amlodipine 5 mg daily and losartan 25 mg daily. No labs done.  Dad with HTN  Today her report head pressure. He has not started the meds yet.  No CP, no SOB. NO new numbness or weakness, no slurred speech, no face droop.  BP Readings from Last 3 Encounters:  08/16/19 (!) 152/82  07/26/18 140/90  03/13/18 (!) 158/92      Wt Readings from Last 3 Encounters:  08/16/19 277 lb (125.6 kg)  07/26/18 287 lb (130.2 kg)  03/13/18 284 lb 3.2 oz (128.9 kg)     This visit occurred during the SARS-CoV-2 public health emergency.  Safety protocols were in place, including screening questions prior to the visit, additional usage of staff PPE, and extensive cleaning of exam room while observing appropriate contact time as indicated for disinfecting solutions.   COVID 19 screen:  No recent travel or known exposure to COVID19 The patient denies respiratory symptoms of COVID 19 at this time. The importance of social distancing was discussed today.     Review of Systems  Constitutional: Negative for chills and fever.  HENT: Negative for congestion and ear pain.   Eyes: Negative for pain and redness.  Respiratory: Negative for cough and shortness of breath.    Cardiovascular: Negative for chest pain, palpitations and leg swelling.  Gastrointestinal: Negative for abdominal pain, blood in stool, constipation, diarrhea, nausea and vomiting.  Genitourinary: Negative for dysuria.  Musculoskeletal: Negative for falls and myalgias.  Skin: Negative for rash.  Neurological: Negative for dizziness.  Psychiatric/Behavioral: Negative for depression. The patient is not nervous/anxious.       Past Medical History:  Diagnosis Date  . Anxiety    with panic  . Asthma   . Gastroenteritis 6/02  . GERD (gastroesophageal reflux disease)   . Hyperlipidemia   . Lumbar disc disease   . Pneumonia 1995  . Sleep apnea    USES CPAP    reports that he has never smoked. He has never used smokeless tobacco. He reports current alcohol use.   Current Outpatient Medications:  .  albuterol (PROVENTIL) (2.5 MG/3ML) 0.083% nebulizer solution, USE 1 VIAL IN NEBULIZER AS DIRECTED, Disp: 75 mL, Rfl: 3 .  albuterol (VENTOLIN HFA) 108 (90 Base) MCG/ACT inhaler, Inhale 2 puffs into the lungs every 6 (six) hours as needed for wheezing or shortness of breath., Disp: 18 g, Rfl: 0 .  famotidine (PEPCID) 20 MG tablet, Take 20 mg by mouth daily., Disp: , Rfl:  .  ibuprofen (ADVIL,MOTRIN) 200 MG tablet, Take 200 mg by mouth as needed. For pain , Disp: , Rfl:  .  Multiple Vitamin (MULTIVITAMIN) capsule, Take 1 capsule by mouth daily., Disp: , Rfl:  .  traMADol (ULTRAM)  50 MG tablet, Take 1 tablet (50 mg total) by mouth 4 (four) times daily., Disp: 120 tablet, Rfl: 0 .  amLODipine (NORVASC) 5 MG tablet, Take by mouth. (Patient not taking: Reported on 08/16/2019), Disp: , Rfl:  .  losartan (COZAAR) 25 MG tablet, Take by mouth. (Patient not taking: Reported on 08/16/2019), Disp: , Rfl:  .  meclizine (ANTIVERT) 25 MG tablet, Take by mouth. (Patient not taking: Reported on 08/16/2019), Disp: , Rfl:    Observations/Objective: Blood pressure (!) 152/82, pulse 73, temperature 98.7 F (37.1 C),  temperature source Temporal, height 6\' 2"  (1.88 m), weight 277 lb (125.6 kg), SpO2 97 %.  Physical Exam Constitutional:      Appearance: He is well-developed.  HENT:     Head: Normocephalic.     Right Ear: Hearing normal.     Left Ear: Hearing normal.     Nose: Nose normal.  Neck:     Thyroid: No thyroid mass or thyromegaly.     Vascular: No carotid bruit.     Trachea: Trachea normal.  Cardiovascular:     Rate and Rhythm: Normal rate and regular rhythm.     Pulses: Normal pulses.     Heart sounds: Heart sounds not distant. No murmur heard.  No friction rub. No gallop.      Comments: No peripheral edema Pulmonary:     Effort: Pulmonary effort is normal. No respiratory distress.     Breath sounds: Normal breath sounds.  Skin:    General: Skin is warm and dry.     Findings: No rash.  Psychiatric:        Speech: Speech normal.        Behavior: Behavior normal.        Thought Content: Thought content normal.      Assessment and Plan   Benign essential hypertension  Neg eval at ER.  No neuro changes.  Start losartan 50 mg to utilize just one BP med instead of 2.  Follow at home. Start work on heart healthy diet and once BP at goal.. start regular exercise, weight loss.  Follow up in 1-2 weeks with PCP.  Vertigo May be due to BP elevation.. but can start home desensitization exercises if not improving with BP decreasing. Meclizine prn.     Eliezer Lofts, MD

## 2019-08-16 NOTE — Assessment & Plan Note (Signed)
Neg eval at ER.  No neuro changes.  Start losartan 50 mg to utilize just one BP med instead of 2.  Follow at home. Start work on heart healthy diet and once BP at goal.. start regular exercise, weight loss.  Follow up in 1-2 weeks with PCP.

## 2019-08-16 NOTE — Assessment & Plan Note (Signed)
May be due to BP elevation.. but can start home desensitization exercises if not improving with BP decreasing. Meclizine prn.

## 2019-08-16 NOTE — Patient Instructions (Signed)
Check blood pressure daily .. goal is at least < 140/90.  Start losartan 50 mg daily.  If vertigo persists... you can start desensitization exercises and use meclizine prn.  Work on heart healthy diet, weight loss and exercise once BP at goal.

## 2019-08-17 ENCOUNTER — Telehealth: Payer: Self-pay | Admitting: Internal Medicine

## 2019-08-17 ENCOUNTER — Other Ambulatory Visit: Payer: Self-pay | Admitting: Internal Medicine

## 2019-08-17 ENCOUNTER — Other Ambulatory Visit: Payer: Self-pay

## 2019-08-17 MED ORDER — TRAMADOL HCL 50 MG PO TABS: 50.0000 mg | ORAL_TABLET | Freq: Four times a day (QID) | ORAL | 0 refills | Status: DC

## 2019-08-17 NOTE — Telephone Encounter (Signed)
Lee Valenzuela approved the refill at 1pm. Advised pt by VM.

## 2019-08-17 NOTE — Telephone Encounter (Signed)
Patient's wife called office stating her husband tramadol 50 MG was not refilled and he has been in the emergency room a recently and that its urgent for him to get this refill before he leaves town this Sunday. Patient's wife would like [place] to call her to update her on refill at [phone].

## 2019-08-17 NOTE — Telephone Encounter (Signed)
Last filled 07-19-19 #120 Last OV 08-16-19 Next OV 09-12-19 [facility]

## 2019-08-23 ENCOUNTER — Encounter: Payer: Self-pay | Admitting: Family Medicine

## 2019-08-23 ENCOUNTER — Other Ambulatory Visit: Payer: Self-pay

## 2019-08-23 ENCOUNTER — Ambulatory Visit (INDEPENDENT_AMBULATORY_CARE_PROVIDER_SITE_OTHER): Payer: PRIVATE HEALTH INSURANCE | Admitting: Family Medicine

## 2019-08-23 VITALS — BP 146/80 | HR 67 | Temp 97.9°F | Ht 74.0 in | Wt 278.5 lb

## 2019-08-23 DIAGNOSIS — I10 Essential (primary) hypertension: Secondary | ICD-10-CM | POA: Diagnosis not present

## 2019-08-23 DIAGNOSIS — R42 Dizziness and giddiness: Secondary | ICD-10-CM

## 2019-08-23 LAB — BASIC METABOLIC PANEL
BUN: 14 mg/dL (ref 6–23)
CO2: 30 mEq/L (ref 19–32)
Calcium: 9.2 mg/dL (ref 8.4–10.5)
Chloride: 104 mEq/L (ref 96–112)
Creatinine, Ser: 0.91 mg/dL (ref 0.40–1.50)
GFR: 85.7 mL/min (ref >60.00)
Glucose, Bld: 82 mg/dL (ref 70–99)
Potassium: 3.8 mEq/L (ref 3.5–5.1)
Sodium: 139 mEq/L (ref 135–145)

## 2019-08-23 NOTE — Assessment & Plan Note (Signed)
Check BMET after ACEI initiation. Increase to 50 mg daily.  Follow BP at home.. follow up with PCP as scheduled.

## 2019-08-23 NOTE — Assessment & Plan Note (Signed)
Improving with time, BP control and desensitization exercises.

## 2019-08-23 NOTE — Patient Instructions (Addendum)
Increase losartan to 50 mg daily ( 1 full tab).  Follow BP at home.. Goal < 140/90.   Bring measurements in to PCP  At next OV.   Please stop at the lab to have labs drawn.

## 2019-08-23 NOTE — Progress Notes (Signed)
Chief Complaint  Patient presents with  . Follow-up    HTN/Vertigo    History of Present Illness: HPI  59 year old male presents for follow up HTN.   At last appt on 08/16/2019 following ER visit for headache, vertigo.. found to have elevated BP. Started on losartan  1/2 tab of 50 mg daily .  He has tolerated it.. no SE. Heart healthy diet BP Readings from Last 3 Encounters:  08/23/19 (!) 146/80  08/16/19 (!) 152/82  07/26/18 140/90   Wt Readings from Last 3 Encounters:  08/23/19 (!) 278 lb 8 oz (126.3 kg)  08/16/19 277 lb (125.6 kg)  07/26/18 287 lb (130.2 kg)   He has improved vertigo.. no room spinning.. but feeling off balance when moving head.  Has use small dose of meclizine at time.  Has done some desensitization exercises.  Head pressure improved.  BP at home  150-160/110-90  HR 67-75   No CP, NO SOB, no edema.   This visit occurred during the SARS-CoV-2 public health emergency.  Safety protocols were in place, including screening questions prior to the visit, additional usage of staff PPE, and extensive cleaning of exam room while observing appropriate contact time as indicated for disinfecting solutions.   COVID 19 screen:  No recent travel or known exposure to COVID19 The patient denies respiratory symptoms of COVID 19 at this time. The importance of social distancing was discussed today.     Review of Systems  Constitutional: Negative for chills and fever.  HENT: Negative for congestion and ear pain.   Eyes: Negative for pain and redness.  Respiratory: Negative for cough and shortness of breath.   Cardiovascular: Negative for chest pain, palpitations and leg swelling.  Gastrointestinal: Negative for abdominal pain, blood in stool, constipation, diarrhea, nausea and vomiting.  Genitourinary: Negative for dysuria.  Musculoskeletal: Negative for falls and myalgias.  Skin: Negative for rash.  Neurological: Negative for dizziness.  Psychiatric/Behavioral:  Negative for depression. The patient is not nervous/anxious.       Past Medical History:  Diagnosis Date  . Anxiety    with panic  . Asthma   . Gastroenteritis 6/02  . GERD (gastroesophageal reflux disease)   . Hyperlipidemia   . Lumbar disc disease   . Pneumonia 1995  . Sleep apnea    USES CPAP    reports that he has never smoked. He has never used smokeless tobacco. He reports current alcohol use.   Current Outpatient Medications:  .  albuterol (PROVENTIL) (2.5 MG/3ML) 0.083% nebulizer solution, USE 1 VIAL IN NEBULIZER AS DIRECTED, Disp: 75 mL, Rfl: 3 .  albuterol (VENTOLIN HFA) 108 (90 Base) MCG/ACT inhaler, Inhale 2 puffs into the lungs every 6 (six) hours as needed for wheezing or shortness of breath., Disp: 18 g, Rfl: 0 .  famotidine (PEPCID) 20 MG tablet, Take 20 mg by mouth daily., Disp: , Rfl:  .  ibuprofen (ADVIL,MOTRIN) 200 MG tablet, Take 200 mg by mouth as needed. For pain , Disp: , Rfl:  .  losartan (COZAAR) 50 MG tablet, Take 0.5 tablets (25 mg total) by mouth daily., Disp: 30 tablet, Rfl: 11 .  meclizine (ANTIVERT) 25 MG tablet, Take 1 tablet (25 mg total) by mouth 3 (three) times daily as needed., Disp: 15 tablet, Rfl: 0 .  Multiple Vitamin (MULTIVITAMIN) capsule, Take 1 capsule by mouth daily., Disp: , Rfl:  .  traMADol (ULTRAM) 50 MG tablet, Take 1 tablet (50 mg total) by mouth 4 (four) times  daily., Disp: 120 tablet, Rfl: 0   Observations/Objective: Blood pressure (!) 146/80, pulse 67, temperature 97.9 F (36.6 C), temperature source Temporal, height 6\' 2"  (1.88 m), weight (!) 278 lb 8 oz (126.3 kg), SpO2 97 %.  Physical Exam Constitutional:      Appearance: He is well-developed.  HENT:     Head: Normocephalic.     Right Ear: Hearing normal.     Left Ear: Hearing normal.     Nose: Nose normal.  Neck:     Thyroid: No thyroid mass or thyromegaly.     Vascular: No carotid bruit.     Trachea: Trachea normal.  Cardiovascular:     Rate and Rhythm: Normal  rate and regular rhythm.     Pulses: Normal pulses.     Heart sounds: Heart sounds not distant. No murmur heard.  No friction rub. No gallop.      Comments: No peripheral edema Pulmonary:     Effort: Pulmonary effort is normal. No respiratory distress.     Breath sounds: Normal breath sounds.  Skin:    General: Skin is warm and dry.     Findings: No rash.  Psychiatric:        Speech: Speech normal.        Behavior: Behavior normal.        Thought Content: Thought content normal.      Assessment and Plan   Benign essential hypertension Check BMET after ACEI initiation. Increase to 50 mg daily.  Follow BP at home.. follow up with PCP as scheduled.  Vertigo Improving with time, BP control and desensitization exercises.     Eliezer Lofts, MD

## 2019-09-12 ENCOUNTER — Other Ambulatory Visit: Payer: Self-pay

## 2019-09-12 ENCOUNTER — Ambulatory Visit (INDEPENDENT_AMBULATORY_CARE_PROVIDER_SITE_OTHER): Payer: PRIVATE HEALTH INSURANCE | Admitting: Internal Medicine

## 2019-09-12 ENCOUNTER — Encounter: Payer: Self-pay | Admitting: Internal Medicine

## 2019-09-12 VITALS — BP 128/84 | HR 74 | Temp 97.9°F | Ht 74.0 in | Wt 275.0 lb

## 2019-09-12 DIAGNOSIS — M4722 Other spondylosis with radiculopathy, cervical region: Secondary | ICD-10-CM | POA: Diagnosis not present

## 2019-09-12 DIAGNOSIS — Z0000 Encounter for general adult medical examination without abnormal findings: Secondary | ICD-10-CM

## 2019-09-12 DIAGNOSIS — I10 Essential (primary) hypertension: Secondary | ICD-10-CM | POA: Diagnosis not present

## 2019-09-12 DIAGNOSIS — G4733 Obstructive sleep apnea (adult) (pediatric): Secondary | ICD-10-CM

## 2019-09-12 NOTE — Assessment & Plan Note (Signed)
Has been working on fitness Flu vaccine in the fall Overdue for colon---message sent to Dr Hilarie Fredrickson that he is ready to schedule Discussed PSA---will continue to defer

## 2019-09-12 NOTE — Assessment & Plan Note (Signed)
BP Readings from Last 3 Encounters:  09/12/19 128/84  08/23/19 (!) 146/80  08/16/19 (!) 152/82   Doing better on the losartan

## 2019-09-12 NOTE — Assessment & Plan Note (Signed)
Also with recurrence of lumbar radiculopathy Will set up with physiatrist

## 2019-09-12 NOTE — Progress Notes (Signed)
Subjective:    Patient ID: Lee Valenzuela, male    DOB: 15-Sep-1960, 59 y.o.   MRN: [id number]  HPI Here for physical This visit occurred during the SARS-CoV-2 public health emergency.  Safety protocols were in place, including screening questions prior to the visit, additional usage of staff PPE, and extensive cleaning of exam room while observing appropriate contact time as indicated for disinfecting solutions.   Doing okay on the BP medications Checks at home Usually ~140/? No dizziness or other problems with the medication No vertigo now--but can still feel off balance if he turns quickly  2 issues with back that are troubling him Did have improvement in right shoulder/arm after ESI Now worsening again---background pain and will have sharp "catches"  Also having trouble with his right leg again (no symptoms since second surgery on back some years ago) No back pain---but sensory issues and occasional pain down to foot Has sense of restless legs  Asthma actually better with the mask Not needing the albuterol  Current Outpatient Medications on File Prior to Visit  Medication Sig Dispense Refill  . albuterol (PROVENTIL) (2.5 MG/3ML) 0.083% nebulizer solution USE 1 VIAL IN NEBULIZER AS DIRECTED 75 mL 3  . albuterol (VENTOLIN HFA) 108 (90 Base) MCG/ACT inhaler Inhale 2 puffs into the lungs every 6 (six) hours as needed for wheezing or shortness of breath. 18 g 0  . famotidine (PEPCID) 20 MG tablet Take 20 mg by mouth daily.    Marland Kitchen ibuprofen (ADVIL,MOTRIN) 200 MG tablet Take 200 mg by mouth as needed. For pain     . losartan (COZAAR) 50 MG tablet Take 0.5 tablets (25 mg total) by mouth daily. 30 tablet 11  . meclizine (ANTIVERT) 25 MG tablet Take 1 tablet (25 mg total) by mouth 3 (three) times daily as needed. 15 tablet 0  . Multiple Vitamin (MULTIVITAMIN) capsule Take 1 capsule by mouth daily.    . traMADol (ULTRAM) 50 MG tablet Take 1 tablet (50 mg total) by mouth 4 (four) times  daily. 120 tablet 0   No current facility-administered medications on file prior to visit.    Allergies  Allergen Reactions  . Ciprofloxacin     REACTION: ? Rash    Past Medical History:  Diagnosis Date  . Anxiety    with panic  . Asthma   . Gastroenteritis 6/02  . GERD (gastroesophageal reflux disease)   . Hyperlipidemia   . Lumbar disc disease   . Pneumonia 1995  . Sleep apnea    USES CPAP    Past Surgical History:  Procedure Laterality Date  . KNEE ARTHROSCOPY  2/12   L knee  . LAMINECTOMY  1997  . LUMBAR SPINE SURGERY  2007   x 2, fusion  . MENISCUS REPAIR  2/12   [employer]  . SPINAL FUSION      Family History  Problem Relation Age of Onset  . Ovarian cancer Paternal Grandmother   . Liver cancer Maternal Grandfather   . Depression Father        nervous breakdown  . Hypertension Father   . Ulcers Father   . Cancer Father        dying of myelodysplasia  . Bipolar disorder Son   . Schizophrenia Son   . Other Sister        nervous breakdown  . Asthma Sister   . Allergies Other        In family  . Coronary artery disease Neg Hx   .  Diabetes Neg Hx   . Migraines Neg Hx   . Colon cancer Neg Hx   . Esophageal cancer Neg Hx   . Rectal cancer Neg Hx   . Stomach cancer Neg Hx     Social History   Socioeconomic History  . Marital status: Married    Spouse name: Not on file  . Number of children: 3  . Years of education: Not on file  . Highest education level: Not on file  Occupational History  . Occupation: Probation officer    Comment:    Tobacco Use  . Smoking status: Never Smoker  . Smokeless tobacco: Never Used  Substance and Sexual Activity  . Alcohol use: Yes    Comment: [employer]  . Drug use: Not on file  . Sexual activity: Not on file  Other Topics Concern  . Not on file  Social History Narrative  . Not on file   Social Determinants of Health   Financial Resource Strain:   .  Difficulty of Paying Living Expenses:   Food Insecurity:   . Worried About Charity fundraiser in the Last Year:   . Arboriculturist in the Last Year:   Transportation Needs:   . Film/video editor (Medical):   Marland Kitchen Lack of Transportation (Non-Medical):   Physical Activity:   . Days of Exercise per Week:   . Minutes of Exercise per Session:   Stress:   . Feeling of Stress :   Social Connections:   . Frequency of Communication with Friends and Family:   . Frequency of Social Gatherings with Friends and Family:   . Attends Religious Services:   . Active Member of Clubs or Organizations:   . Attends Archivist Meetings:   Marland Kitchen Marital Status:   Intimate Partner Violence:   . Fear of Current or Ex-Partner:   . Emotionally Abused:   Marland Kitchen Physically Abused:   . Sexually Abused:    Review of Systems  Constitutional: Negative for fatigue.       Did lose some weight from last year Wears seat belt  HENT: Negative for dental problem, hearing loss and tinnitus.   Eyes: Negative for visual disturbance.       No diplopia or unilateral vision loss  Respiratory: Negative for cough, chest tightness, shortness of breath and wheezing.   Cardiovascular: Negative for chest pain, palpitations and leg swelling.  Gastrointestinal: Negative for blood in stool and constipation.       No heartburn--hasn't needed the famotidine  Endocrine: Negative for polydipsia and polyuria.  Genitourinary: Negative for difficulty urinating and urgency.       No sex--no problem  Musculoskeletal: Positive for arthralgias and back pain. Negative for joint swelling.  Skin: Negative for rash.       Same warty growths on back  Allergic/Immunologic: Positive for environmental allergies. Negative for immunocompromised state.  Neurological: Negative for dizziness, syncope, light-headedness and headaches.  Hematological: Negative for adenopathy. Does not bruise/bleed easily.  Psychiatric/Behavioral: Negative for  dysphoric mood.       Not sleeping well due to leg and arm pain Still uses CPAP every night       Objective:   Physical Exam Constitutional:      Appearance: Normal appearance.  HENT:     Head: Normocephalic and atraumatic.     Right Ear: Tympanic membrane, ear canal and external ear normal.     Left Ear: Tympanic membrane,  ear canal and external ear normal.     Mouth/Throat:     Pharynx: No oropharyngeal exudate or posterior oropharyngeal erythema.  Eyes:     Conjunctiva/sclera: Conjunctivae normal.     Pupils: Pupils are equal, round, and reactive to light.  Cardiovascular:     Rate and Rhythm: Normal rate and regular rhythm.     Pulses: Normal pulses.     Heart sounds: No murmur heard.  No gallop.   Pulmonary:     Effort: Pulmonary effort is normal.     Breath sounds: Normal breath sounds. No wheezing or rales.  Abdominal:     Palpations: Abdomen is soft.     Tenderness: There is no abdominal tenderness.  Musculoskeletal:     Cervical back: Neck supple.     Right lower leg: No edema.     Left lower leg: No edema.  Lymphadenopathy:     Cervical: No cervical adenopathy.  Skin:    Findings: No rash.  Neurological:     Mental Status: He is alert and oriented to person, place, and time.  Psychiatric:        Mood and Affect: Mood normal.        Behavior: Behavior normal.            Assessment & Plan:

## 2019-09-12 NOTE — Assessment & Plan Note (Signed)
Uses the CPAP nightly

## 2019-09-14 ENCOUNTER — Other Ambulatory Visit: Payer: Self-pay | Admitting: Internal Medicine

## 2019-09-14 MED ORDER — TRAMADOL HCL 50 MG PO TABS: 50.0000 mg | ORAL_TABLET | Freq: Four times a day (QID) | ORAL | 0 refills | Status: DC

## 2019-09-14 NOTE — Telephone Encounter (Signed)
Last filled7-23-21 #120 Last OV 08-16-19 Next OV 09-12-19 [facility]

## 2019-09-17 ENCOUNTER — Telehealth: Payer: Self-pay | Admitting: *Deleted

## 2019-09-17 NOTE — Telephone Encounter (Signed)
Left message for patient to call back to schedule colonoscopy.

## 2019-09-17 NOTE — Telephone Encounter (Signed)
-----   Message from Jerene Bears, MD sent at 09/17/2019 10:24 AM EDT ----- Will do. Thanks Willis Modena,  See note from Dr. Silvio Pate, pt needs to be scheduled for colon JMP ----- Message ----- From: Venia Carbon, MD Sent: 09/12/2019   9:49 AM EDT To: Jerene Bears, MD  Ulice Dash, He is now ready to schedule the follow up colon he is overdue for. Can your staff contact him about this? Rich

## 2019-09-19 NOTE — Telephone Encounter (Signed)
Left message for patient to call back  

## 2019-09-21 NOTE — Telephone Encounter (Signed)
I have spoken to patient. He indicates that he is currently not ready to schedule colonoscopy but will call our office when he is ready to schedule. He states he has been very busy with work.

## 2019-09-26 MED ORDER — LOSARTAN POTASSIUM 50 MG PO TABS: 50.0000 mg | ORAL_TABLET | Freq: Every day | ORAL | 3 refills | Status: DC

## 2019-10-14 ENCOUNTER — Other Ambulatory Visit: Payer: Self-pay

## 2019-10-15 MED ORDER — TRAMADOL HCL 50 MG PO TABS: 50.0000 mg | ORAL_TABLET | Freq: Four times a day (QID) | ORAL | 0 refills | Status: DC

## 2019-10-15 NOTE — Telephone Encounter (Signed)
LAST APPOINTMENT DATE: 09/12/2018 CPE    NEXT APPOINTMENT DATE: 09/12/2020   LAST REFILL: 8/520/2021  QTY: #120 no rf      .

## 2019-11-13 ENCOUNTER — Other Ambulatory Visit: Payer: Self-pay

## 2019-11-14 MED ORDER — TRAMADOL HCL 50 MG PO TABS: 50.0000 mg | ORAL_TABLET | Freq: Four times a day (QID) | ORAL | 0 refills | Status: DC

## 2019-11-14 NOTE — Telephone Encounter (Signed)
Last filled9-20-21 #120 Last OV 08-16-19 Next OV8-18-21 [facility]

## 2019-12-14 ENCOUNTER — Other Ambulatory Visit: Payer: Self-pay

## 2019-12-17 MED ORDER — TRAMADOL HCL 50 MG PO TABS: 50.0000 mg | ORAL_TABLET | Freq: Four times a day (QID) | ORAL | 0 refills | Status: DC

## 2019-12-17 NOTE — Telephone Encounter (Signed)
Last filled10-20-21 #120 Last OV 08-16-19 Next OV8-18-21 [facility]

## 2020-01-12 ENCOUNTER — Other Ambulatory Visit: Payer: Self-pay

## 2020-01-14 MED ORDER — TRAMADOL HCL 50 MG PO TABS: 50.0000 mg | ORAL_TABLET | Freq: Four times a day (QID) | ORAL | 0 refills | Status: DC

## 2020-01-14 NOTE — Telephone Encounter (Signed)
Last filled11-22-21 #120 Last OV 08-16-19 Next OV8-18-21 [facility]

## 2020-01-24 ENCOUNTER — Encounter: Payer: Self-pay | Admitting: Family Medicine

## 2020-01-24 ENCOUNTER — Other Ambulatory Visit: Payer: Self-pay

## 2020-01-24 ENCOUNTER — Ambulatory Visit (INDEPENDENT_AMBULATORY_CARE_PROVIDER_SITE_OTHER): Payer: PRIVATE HEALTH INSURANCE | Admitting: Family Medicine

## 2020-01-24 VITALS — BP 146/80 | HR 73 | Temp 98.0°F | Ht 74.0 in | Wt 276.5 lb

## 2020-01-24 DIAGNOSIS — Z9889 Other specified postprocedural states: Secondary | ICD-10-CM | POA: Diagnosis not present

## 2020-01-24 DIAGNOSIS — M542 Cervicalgia: Secondary | ICD-10-CM | POA: Diagnosis not present

## 2020-01-24 MED ORDER — PREDNISONE 20 MG PO TABS: ORAL_TABLET | ORAL | 0 refills | Status: DC

## 2020-01-24 NOTE — Progress Notes (Signed)
Lee Frank T. Zelie Asbill, MD, [name] [facility] [place], [address]  Phone: [phone]  FAX: [phone]  GOD PEACHES - 59 y.o. male  MRN [id number]  Date of Birth: March 30, 1960  Date: 01/24/2020  PCP: Venia Carbon, MD  Referral: Venia Carbon, MD  Chief Complaint  Patient presents with  . Neck Pain    This visit occurred during the SARS-CoV-2 public health emergency.  Safety protocols were in place, including screening questions prior to the visit, additional usage of staff PPE, and extensive cleaning of exam room while observing appropriate contact time as indicated for disinfecting solutions.   Subjective:   Lee Valenzuela is a 59 y.o. very pleasant male patient with Body mass index is 35.5 kg/m. who presents with the following:  He has a history of spinal fusion, laminectomy, and additional spine surgery, as well. 1997, 2007.  Woke up with some severe neck pain on Monday morning.   Has never had neck surgery.  Tried some gabapentin.  3 times in the last few months, he has had some back pain. This is distinctly different and it is not in his back, but rather it is in his neck.  Initially he had significant inability to rotate his neck.  L sided will have some radiculopathy. Pain  In the shoulder blades.  Could not turn is neck at all.   On his own: He is decided to wear a huge neck brace.  Doing better with just sitting here.   Ibuprofen TID, 600 mg.   Review of Systems is noted in the HPI, as appropriate   Objective:   BP (!) 146/80   Pulse 73   Temp 98 F (36.7 C) (Temporal)   Ht 6\' 2"  (1.88 m)   Wt 276 lb 8 oz (125.4 kg)   SpO2 100%   BMI 35.50 kg/m    GEN: alert,appropriate PSYCH: Normally interactive. Cooperative during the interview.   CERVICAL SPINE EXAM Range of motion: Flexion, extension, lateral bending, and rotation: He  has approximate 50% loss of range of motion in all directions Pain with terminal motion: Yes Spinous Processes: NT SCM: NT Upper paracervical muscles: Tender to palpation directly posterior Upper traps: Tender to palpation C5-T1 intact, sensation and motor   Radiology: No results found.   Assessment and Plan:     ICD-10-CM   1. Cervicalgia  M54.2   2. Personal history of spine surgery  Z98.890    Acute cervicalgia.  Acute on chronic pain.  Reviewed with him some very basic range of motion and I really do not think that he should continue his neck brace and should wean out of it over the next week.  He ideally would be able to lose that even faster than this.  We reviewed how this could potentially cause muscular atrophy.  Meds ordered this encounter  Medications  . predniSONE (DELTASONE) 20 MG tablet    Sig: 2 tabs po daily for 5 days, then 1 tab po daily for 5 days    Dispense:  15 tablet    Refill:  0   There are no discontinued medications. No orders of the defined types were placed in this encounter.   Follow-up: No follow-ups on file.  Signed,  Maud Deed. Andreia Gandolfi, MD   Outpatient Encounter Medications as of 01/24/2020  Medication Sig  . albuterol (PROVENTIL) (2.5 MG/3ML) 0.083% nebulizer solution USE  1 VIAL IN NEBULIZER AS DIRECTED  . albuterol (VENTOLIN HFA) 108 (90 Base) MCG/ACT inhaler Inhale 2 puffs into the lungs every 6 (six) hours as needed for wheezing or shortness of breath.  . gabapentin (NEURONTIN) 300 MG capsule Take 1 capsule by mouth in the morning and at bedtime.  Marland Kitchen ibuprofen (ADVIL,MOTRIN) 200 MG tablet Take 200 mg by mouth as needed. For pain  . losartan (COZAAR) 50 MG tablet Take 1 tablet (50 mg total) by mouth daily.  . Multiple Vitamin (MULTIVITAMIN) capsule Take 1 capsule by mouth daily.  . predniSONE (DELTASONE) 20 MG tablet 2 tabs po daily for 5 days, then 1 tab po daily for 5 days  . traMADol (ULTRAM) 50 MG tablet Take 1 tablet (50 mg total)  by mouth 4 (four) times daily.   No facility-administered encounter medications on file as of 01/24/2020.

## 2020-02-10 ENCOUNTER — Other Ambulatory Visit: Payer: Self-pay

## 2020-02-11 MED ORDER — TRAMADOL HCL 50 MG PO TABS: 50.0000 mg | ORAL_TABLET | Freq: Four times a day (QID) | ORAL | 0 refills | Status: DC

## 2020-02-11 NOTE — Telephone Encounter (Signed)
Last filled 01-14-20 #150 Last OV Acute 01-24-20 Dr Lorelei Pont Next OV 09-12-20 [facility]

## 2020-03-11 ENCOUNTER — Other Ambulatory Visit: Payer: Self-pay

## 2020-03-11 MED ORDER — TRAMADOL HCL 50 MG PO TABS: 50.0000 mg | ORAL_TABLET | Freq: Four times a day (QID) | ORAL | 0 refills | Status: DC

## 2020-03-11 NOTE — Telephone Encounter (Signed)
Last filled 02-11-20 #150 Last OV Acute 01-24-20 Dr Lorelei Pont Next OV 09-12-20 [facility]

## 2020-04-09 ENCOUNTER — Other Ambulatory Visit: Payer: Self-pay | Admitting: Internal Medicine

## 2020-04-09 DIAGNOSIS — G629 Polyneuropathy, unspecified: Secondary | ICD-10-CM | POA: Insufficient documentation

## 2020-04-09 DIAGNOSIS — R7303 Prediabetes: Secondary | ICD-10-CM | POA: Insufficient documentation

## 2020-04-10 NOTE — Telephone Encounter (Signed)
Last filled 03-11-20 #150 Last OV Acute 01-24-20 Dr Lorelei Pont Next OV 09-12-20 [facility]

## 2020-04-18 ENCOUNTER — Encounter: Payer: Self-pay | Admitting: Internal Medicine

## 2020-05-21 DIAGNOSIS — G8929 Other chronic pain: Secondary | ICD-10-CM | POA: Insufficient documentation

## 2020-09-03 ENCOUNTER — Other Ambulatory Visit: Payer: Self-pay | Admitting: Internal Medicine

## 2020-09-03 MED ORDER — ALBUTEROL SULFATE HFA 108 (90 BASE) MCG/ACT IN AERS: INHALATION_SPRAY | RESPIRATORY_TRACT | 0 refills | Status: DC

## 2020-09-03 NOTE — Addendum Note (Signed)
Addended by: Pilar Grammes on: 09/03/2020 02:14 PM   Modules accepted: Orders

## 2020-09-12 ENCOUNTER — Encounter: Payer: PRIVATE HEALTH INSURANCE | Admitting: Internal Medicine

## 2020-09-27 ENCOUNTER — Other Ambulatory Visit: Payer: Self-pay | Admitting: Internal Medicine

## 2020-12-18 ENCOUNTER — Other Ambulatory Visit: Payer: Self-pay | Admitting: Family Medicine

## 2021-01-07 IMAGING — MR MR CERVICAL SPINE W/O CM
4 of 5 series · 26 of 48 positions shown · non-contrast
Comparison: Prior radiographs from 02/10/2018.

CLINICAL DATA: Initial evaluation for neck pain radiating into the
right shoulder and right upper extremity.

EXAM:
MRI CERVICAL SPINE WITHOUT CONTRAST
TECHNIQUE: Multiplanar, multisequence MR imaging of the cervical spine was
performed. No intravenous contrast was administered.

[Series 6: T1 · sagittal · 3.3mm · 0.66mm/px · 6 of 13 slices shown]
[im 1/13]
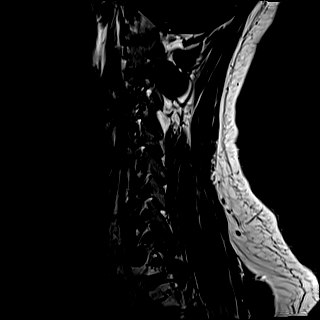
[im 3/13]
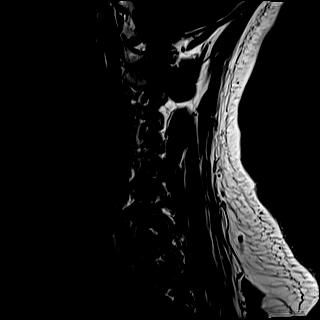
[im 5/13]
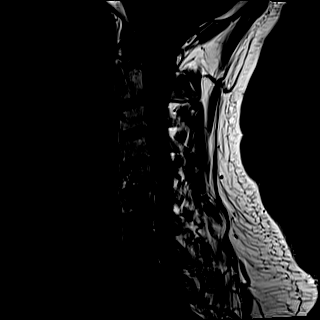
[im 8/13]
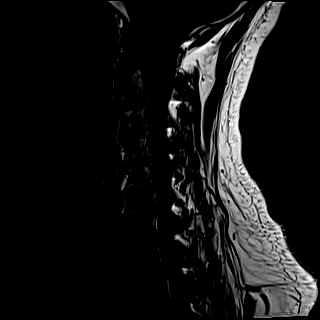
[im 10/13]
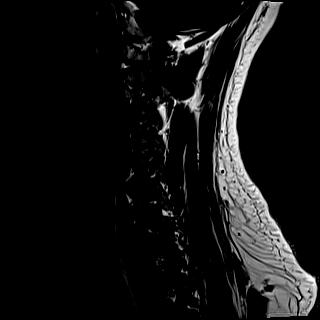
[im 13/13]
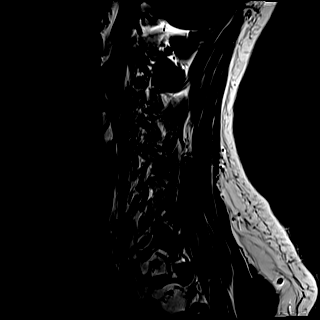

[Series 7: T2 · sagittal · 3.3mm · 0.55mm/px · 6 of 13 slices shown (1 of 2)]
[im 1/13]
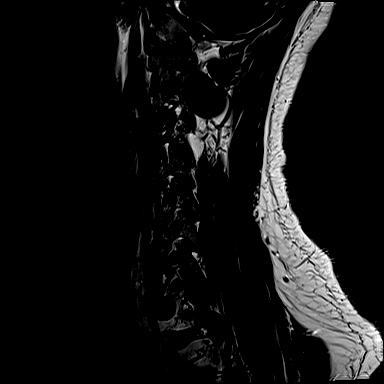
[im 3/13]
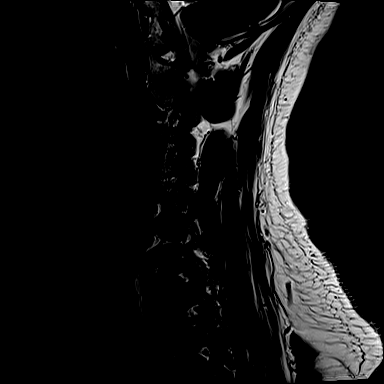
[im 5/13]
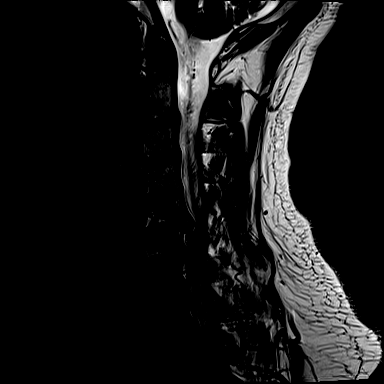
[im 8/13]
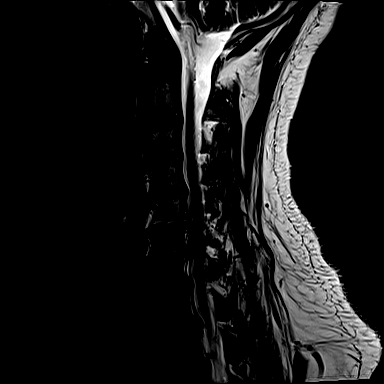
[im 10/13]
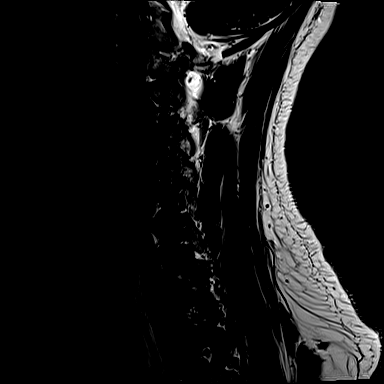
[im 13/13]
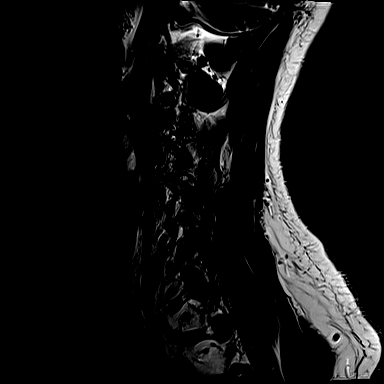

[Series 8: STIR · sagittal · 3.3mm · 0.33mm/px · 5 of 13 slices shown]
[im 1/13]
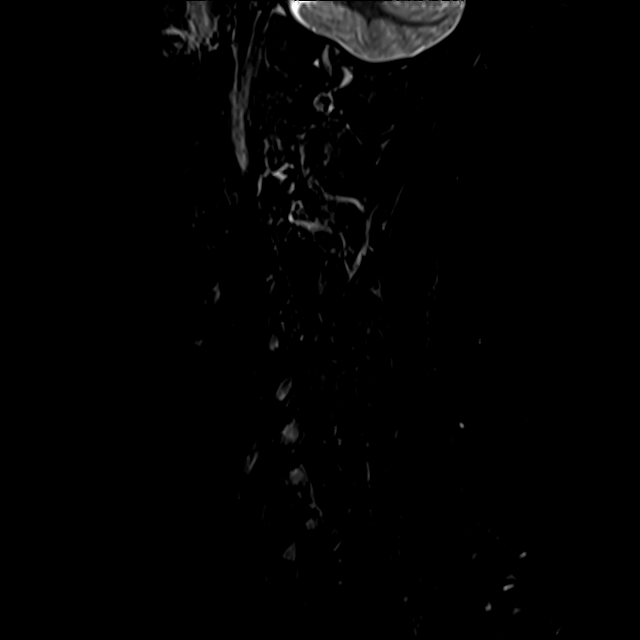
[im 3/13]
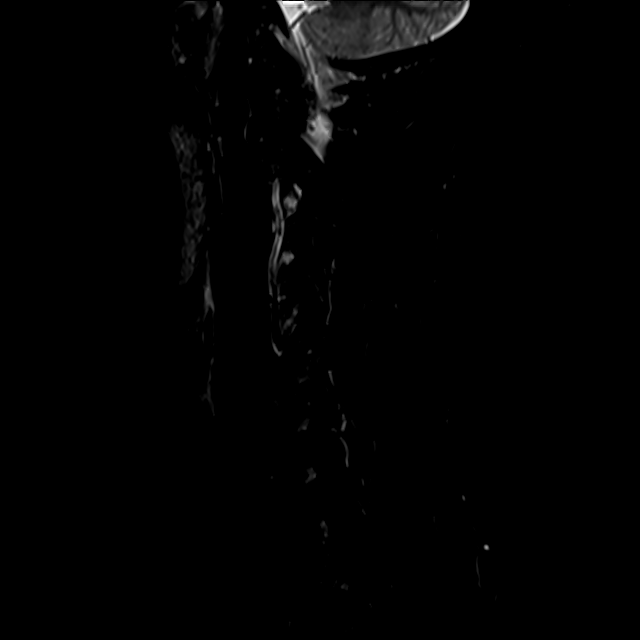
[im 5/13]
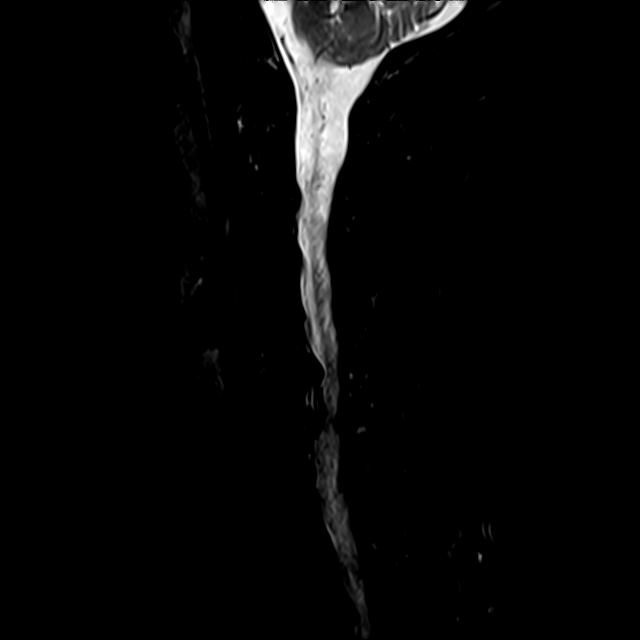
[im 8/13]
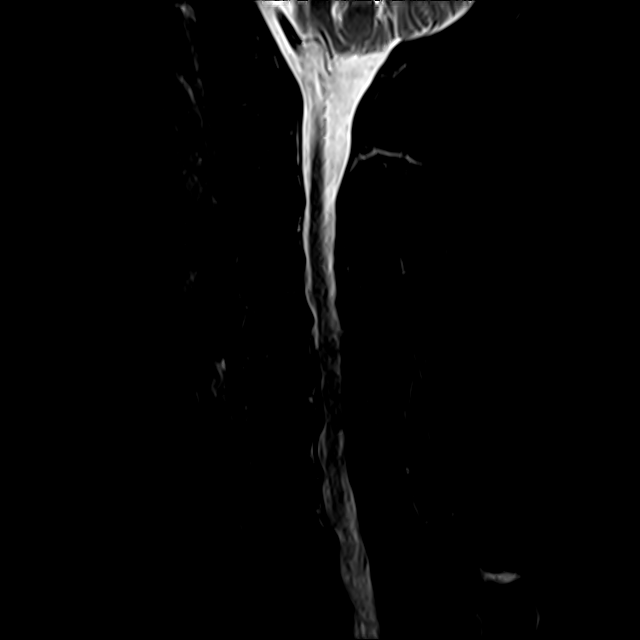
[im 13/13]
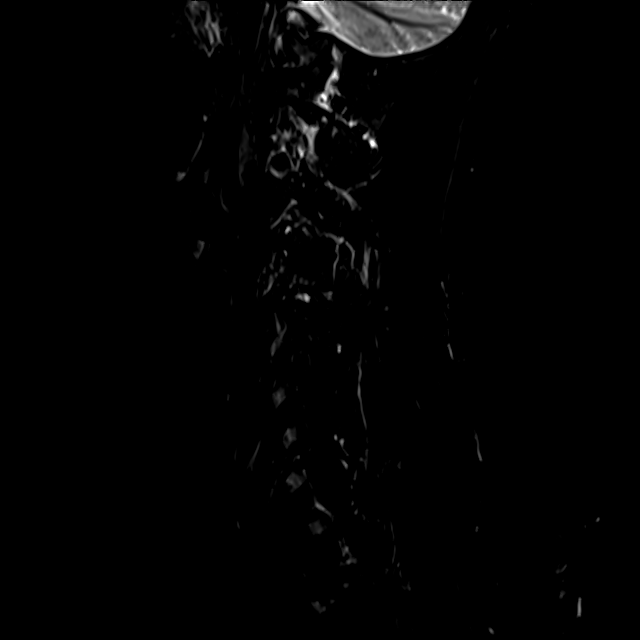

[Series 9: T2 · axial · 3.0mm · 0.53mm/px · z∈[-126,+7]mm · 9 of 32 slices shown (2 of 2)]
[im 1/32]
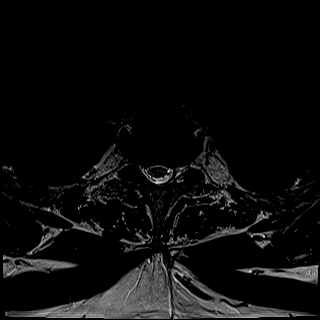
[im 5/32]
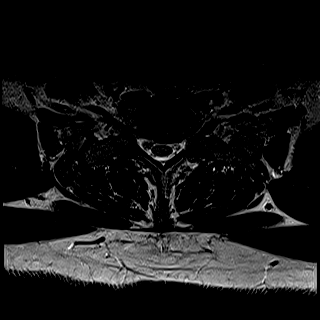
[im 9/32]
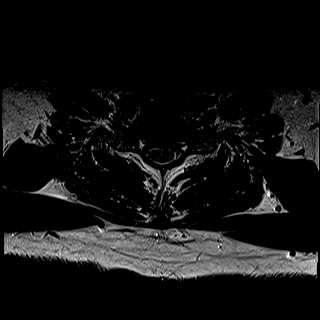
[im 14/32]
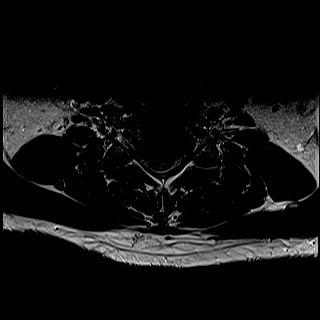
[im 16/32]
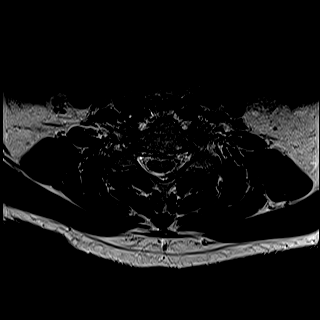
[im 18/32]
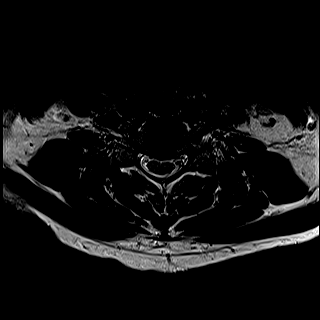
[im 23/32]
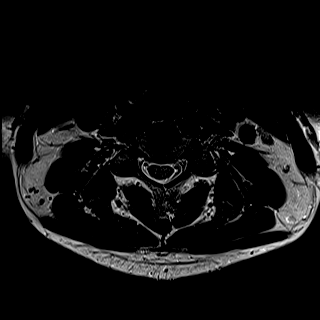
[im 27/32]
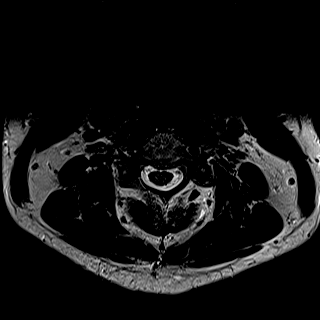
[im 32/32]
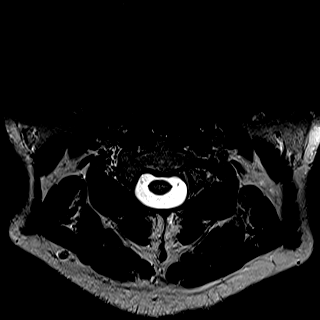

[26 of 48 positions shown; findings below may reference images not displayed]

FINDINGS: Alignment: Straightening with slight reversal of the normal cervical
lordosis. No listhesis.

Vertebrae: Vertebral body heights maintained without evidence for
acute or chronic fracture. Bone marrow signal intensity within
normal limits. No discrete or worrisome osseous lesions. Chronic
reactive endplate changes present about the C5-6 and C6-7
interspaces. No other abnormal marrow edema.

Cord: Signal intensity within the cervical spinal cord is normal.

Posterior Fossa, vertebral arteries, paraspinal tissues: Visualized
brain and posterior fossa within normal limits. Craniocervical
junction normal. Paraspinous and prevertebral soft tissues within
normal limits. Normal intravascular flow voids seen within the
vertebral arteries bilaterally.

Disc levels:

C2-C3: Mild annular disc bulge with uncovertebral hypertrophy. Mild
left-sided facet degeneration. Resultant mild left C3 foraminal
stenosis. No significant canal or right foraminal encroachment.

C3-C4: Mild disc bulging with uncovertebral hypertrophy. Flattening
of the ventral CSF without significant spinal stenosis or cord
deformity. Foramina remain patent.

C4-C5: Diffuse disc bulge with bilateral uncovertebral hypertrophy.
Central/left paracentral disc osteophyte indents the ventral thecal
sac with secondary mild flattening of the left hemi cord (series 10,
image 14). Mild spinal stenosis without cord signal changes.
Superimposed left-sided facet degeneration. Resultant moderate
bilateral C5 foraminal stenosis, left worse than right.

C5-C6: Circumferential disc osteophyte complex with intervertebral
disc space narrowing. Disc osteophyte slightly eccentric to the
right. Broad posterior component flattens and largely effaces the
ventral CSF. Secondary mild cord flattening without cord signal
changes. Moderate spinal stenosis. Resultant fairly severe bilateral
C6 foraminal stenosis.

C6-C7: Circumferential disc osteophyte complex with intervertebral
disc space narrowing. Disc osteophyte slightly eccentric to the
left. Broad posterior component flattens and effaces the ventral
CSF, slightly greater on the left. Secondary cord flattening without
cord signal changes. Superimposed ligamentum flavum thickening.
Moderate spinal stenosis. Fairly severe bilateral C7 foraminal
stenosis, left greater than right.

C7-T1: Mild diffuse disc bulge with bilateral uncovertebral
hypertrophy. Disc bulging slightly eccentric to the left. Flattening
and partial effacement of the ventral CSF with resultant mild spinal
stenosis. No cord deformity or impingement. Mild left C8 foraminal
stenosis. No significant right foraminal encroachment.

Visualized upper thoracic spine demonstrates mild disc bulging at
T1-2 and T2-3 without significant stenosis.
IMPRESSION: 1. Degenerative disc osteophyte complexes at C5-6 and C6-7 with
resultant moderate canal and severe bilateral C6 and C7 foraminal
stenosis. Query bilateral C6 or C7 radiculitis.
2. Left paracentral disc osteophyte at C4-5 with resultant mild
canal with moderate left C5 foraminal stenosis.
3. Left eccentric disc bulge with uncovertebral hypertrophy at C7-T1
with resultant mild canal and left C8 foraminal stenosis.

## 2021-03-13 DIAGNOSIS — M4316 Spondylolisthesis, lumbar region: Secondary | ICD-10-CM | POA: Insufficient documentation

## 2021-04-21 ENCOUNTER — Ambulatory Visit (INDEPENDENT_AMBULATORY_CARE_PROVIDER_SITE_OTHER): Payer: PRIVATE HEALTH INSURANCE | Admitting: Podiatry

## 2021-04-21 ENCOUNTER — Other Ambulatory Visit: Payer: Self-pay

## 2021-04-21 DIAGNOSIS — L600 Ingrowing nail: Secondary | ICD-10-CM

## 2021-04-21 MED ORDER — GENTAMICIN SULFATE 0.1 % EX CREA: 1.0000 "application " | TOPICAL_CREAM | Freq: Two times a day (BID) | CUTANEOUS | 1 refills | Status: DC

## 2021-04-21 NOTE — Progress Notes (Signed)
? ?  Subjective: ?Patient presents today for evaluation of pain to the lateral border left great toe. Patient is concerned for possible ingrown nail.  It is very sensitive to touch.  Patient presents today for further treatment and evaluation. ? ?Past Medical History:  ?Diagnosis Date  ? Anxiety   ? with panic  ? Asthma   ? Gastroenteritis 6/02  ? GERD (gastroesophageal reflux disease)   ? Hyperlipidemia   ? Lumbar disc disease   ? Pneumonia 1995  ? Sleep apnea   ? USES CPAP  ? ? ?Objective:  ?General: Well developed, nourished, in no acute distress, alert and oriented x3  ? ?Dermatology: Skin is warm, dry and supple bilateral.  Lateral border left great toe appears to be erythematous with evidence of an ingrowing nail. Pain on palpation noted to the border of the nail fold. The remaining nails appear unremarkable at this time. There are no open sores, lesions. ? ?Vascular: Dorsalis Pedis artery and Posterior Tibial artery pedal pulses palpable. No lower extremity edema noted.  ? ?Neruologic: Grossly intact via light touch bilateral. ? ?Musculoskeletal: Muscular strength within normal limits in all groups bilateral. Normal range of motion noted to all pedal and ankle joints.  ? ?Assesement: ?#1 Paronychia with ingrowing nail lateral border left great toe ?#2 Pain in toe ? ?Plan of Care:  ?1. Patient evaluated.  ?2. Discussed treatment alternatives and plan of care. Explained nail avulsion procedure and post procedure course to patient. ?3. Patient opted for permanent partial nail avulsion of the ingrown portion of the nail.  ?4. Prior to procedure, local anesthesia infiltration utilized using 3 ml of a 50:50 mixture of 2% plain lidocaine and 0.5% plain marcaine in a normal hallux block fashion and a betadine prep performed.  ?5. Partial permanent nail avulsion with chemical matrixectomy performed using [id number] applications of phenol followed by alcohol flush.  ?6. Light dressing applied.  Post care instructions  provided ?7.  Prescription for gentamicin 2% cream  ?8.  Return to clinic 2 weeks. ? ?Edrick Kins, DPM ?[place] ? ?Dr. Edrick Kins, DPM  ?  ?[address]                                       ?[place], [place] [address]                ?Office [phone]  ?Fax [phone] ? ? ? ? ?

## 2021-05-07 ENCOUNTER — Ambulatory Visit: Payer: No Typology Code available for payment source | Admitting: Podiatry

## 2023-01-31 ENCOUNTER — Encounter: Payer: Self-pay | Admitting: Gastroenterology

## 2023-02-22 ENCOUNTER — Ambulatory Visit: Payer: PRIVATE HEALTH INSURANCE | Admitting: Gastroenterology

## 2023-02-22 NOTE — Progress Notes (Deleted)
 Chief Complaint: Primary GI MD: Dr. Rhea Belton  HPI: 63 year old male history of GERD, anxiety, sleep apnea on CPAP, presents for evaluation of  MR C-spine 03/2018 for neck pain showed degenerative disc at C5-C6, C6-C7 and severe bilateral C6 and C7 for omental stenosis.  Osteophyte at C4-C5 with resultant mild canal with moderate left C5 foraminal stenosis.  Disc bulge with uncovertebral hypertrophy C7-T1  Labs July 2024 unrevealing   PREVIOUS GI WORKUP   Colonoscopy 11/08/2012 by Dr. Rhea Belton for screening - Normal mucosa terminal ileum - 3 sessile polyps ranging between 3 to 5 mm in transverse, descending, distal sigmoid colon.  Polypectomy was performed.  Diagnosis Surgical [P], transverse, descending, distal sigmoid colon polyp, bx - TUBULAR ADENOMA, 2 FRAGMENTS. - HYPERPLASTIC POLYP, 1 FRAGMENT. - NO HIGH GRADE DYSPLASIA OR MALIGNANCY IDENTIFIED  Past Medical History:  Diagnosis Date   Anxiety    with panic   Asthma    Gastroenteritis 6/02   GERD (gastroesophageal reflux disease)    Hyperlipidemia    Lumbar disc disease    Pneumonia 1995   Sleep apnea    USES CPAP    Past Surgical History:  Procedure Laterality Date   KNEE ARTHROSCOPY  2/12   L knee   LAMINECTOMY  1997   LUMBAR SPINE SURGERY  2007   x 2, fusion   MENISCUS REPAIR  2/12   [facility]   SPINAL FUSION      Current Outpatient Medications  Medication Sig Dispense Refill   albuterol (PROVENTIL) (2.5 MG/3ML) 0.083% nebulizer solution USE 1 VIAL IN NEBULIZER AS DIRECTED 75 mL 3   albuterol (VENTOLIN HFA) 108 (90 Base) MCG/ACT inhaler TAKE 2 PUFFS BY MOUTH EVERY 6 HOURS AS NEEDED FOR WHEEZE OR SHORTNESS OF BREATH 18 each 0   amLODipine (NORVASC) 5 MG tablet Take 1 tablet by mouth daily.     atorvastatin (LIPITOR) 20 MG tablet Take 1 tablet by mouth daily.     celecoxib (CELEBREX) 100 MG capsule Take 100 mg by mouth daily.     gabapentin (NEURONTIN) 300 MG capsule Take 1 capsule by mouth in the  morning and at bedtime.     gentamicin cream (GARAMYCIN) 0.1 % Apply 1 application. topically 2 (two) times daily. 30 g 1   ibuprofen (ADVIL,MOTRIN) 200 MG tablet Take 200 mg by mouth as needed. For pain     losartan (COZAAR) 50 MG tablet Take 1 tablet (50 mg total) by mouth daily. 90 tablet 3   Multiple Vitamin (MULTIVITAMIN) capsule Take 1 capsule by mouth daily.     predniSONE (DELTASONE) 20 MG tablet 2 tabs po daily for 5 days, then 1 tab po daily for 5 days 15 tablet 0   traMADol (ULTRAM) 50 MG tablet TAKE 1 TABLET BY MOUTH 4 TIMES DAILY. 120 tablet 0   No current facility-administered medications for this visit.    Allergies as of 02/22/2023 - Review Complete 04/21/2021  Allergen Reaction Noted   Wasp venom Other (See Comments), Swelling, and Hives 03/03/2021   Ciprofloxacin      Family History  Problem Relation Age of Onset   Ovarian cancer Paternal Grandmother    Liver cancer Maternal Grandfather    Depression Father        nervous breakdown   Hypertension Father    Ulcers Father    Cancer Father        dying of myelodysplasia   Bipolar disorder Son    Schizophrenia Son    Other Sister  nervous breakdown   Asthma Sister    Allergies Other        In family   Coronary artery disease Neg Hx    Diabetes Neg Hx    Migraines Neg Hx    Colon cancer Neg Hx    Esophageal cancer Neg Hx    Rectal cancer Neg Hx    Stomach cancer Neg Hx     Social History   Socioeconomic History   Marital status: Married    Spouse name: Not on file   Number of children: 3   Years of education: Not on file   Highest education level: Not on file  Occupational History   Occupation: Retail buyer for [employer]    Comment:    Tobacco Use   Smoking status: Never   Smokeless tobacco: Never  Substance and Sexual Activity   Alcohol use: Yes    Comment: [facility] [employer]   Drug use: Not on file   Sexual activity: Not on file  Other Topics Concern   Not on  file  Social History Narrative   Not on file   Social Drivers of Health   Financial Resource Strain: Not on file  Food Insecurity: Not on file  Transportation Needs: No Transportation Needs (06/02/2021)   Received from [facility]   PRAPARE - Transportation    Lack of Transportation (Medical): No    Lack of Transportation (Non-Medical): No  Physical Activity: Not on file  Stress: Not on file  Social Connections: Unknown (06/07/2021)   Received from [facility]   Social Network    Social Network: Not on file  Intimate Partner Violence: Unknown (04/29/2021)   Received from [facility]   HITS    Physically Hurt: Not on file    Insult or Talk Down To: Not on file    Threaten Physical Harm: Not on file    Scream or Curse: Not on file    Review of Systems:    Constitutional: No weight loss, fever, chills, weakness or fatigue HEENT: Eyes: No change in vision               Ears, Nose, Throat:  No change in hearing or congestion Skin: No rash or itching Cardiovascular: No chest pain, chest pressure or palpitations   Respiratory: No SOB or cough Gastrointestinal: See HPI and otherwise negative Genitourinary: No dysuria or change in urinary frequency Neurological: No headache, dizziness or syncope Musculoskeletal: No new muscle or joint pain Hematologic: No bleeding or bruising Psychiatric: No history of depression or anxiety    Physical Exam:  Vital signs: There were no vitals taken for this visit.  Constitutional: NAD, Well developed, Well nourished, alert and cooperative Head:  Normocephalic and atraumatic. Eyes:   PEERL, EOMI. No icterus. Conjunctiva pink. Respiratory: Respirations even and unlabored. Lungs clear to auscultation bilaterally.   No wheezes, crackles, or rhonchi.  Cardiovascular:  Regular rate and rhythm. No peripheral edema, cyanosis or pallor.  Gastrointestinal:  Soft, nondistended, nontender. No rebound or guarding. Normal bowel sounds. No appreciable  masses or hepatomegaly. Rectal:  Not performed.  Msk:  Symmetrical without gross deformities. Without edema, no deformity or joint abnormality.  Neurologic:  Alert and  oriented x4;  grossly normal neurologically.  Skin:   Dry and intact without significant lesions or rashes. Psychiatric: Oriented to person, place and time. Demonstrates good judgement and reason without abnormal affect or behaviors.   RELEVANT LABS AND IMAGING: CBC  Component Value Date/Time   WBC 5.1 07/26/2018 0833   RBC 4.79 07/26/2018 0833   HGB 14.1 07/26/2018 0833   HGB 15.7 03/29/2011 2104   HCT 41.4 07/26/2018 0833   HCT 46.4 03/29/2011 2104   PLT 225.0 07/26/2018 0833   PLT 216 03/29/2011 2104   MCV 86.4 07/26/2018 0833   MCV 85 03/29/2011 2104   MCH 28.6 03/29/2011 2104   MCHC 34.1 07/26/2018 0833   RDW 13.1 07/26/2018 0833   RDW 13.5 03/29/2011 2104   LYMPHSABS 2.1 03/26/2016 1243   MONOABS 0.5 03/26/2016 1243   EOSABS 0.1 03/26/2016 1243   BASOSABS 0.1 03/26/2016 1243    CMP     Component Value Date/Time   NA 139 08/23/2019 1041   NA 140 03/29/2011 2104   K 3.8 08/23/2019 1041   K 4.2 03/29/2011 2104   CL 104 08/23/2019 1041   CL 103 03/29/2011 2104   CO2 30 08/23/2019 1041   CO2 27 03/29/2011 2104   GLUCOSE 82 08/23/2019 1041   GLUCOSE 113 (H) 03/29/2011 2104   BUN 14 08/23/2019 1041   BUN 14 03/29/2011 2104   CREATININE 0.91 08/23/2019 1041   CREATININE 1.07 03/29/2011 2104   CALCIUM 9.2 08/23/2019 1041   CALCIUM 9.7 03/29/2011 2104   PROT 6.9 07/26/2018 0833   PROT 8.2 03/29/2011 2104   ALBUMIN 4.4 07/26/2018 0833   ALBUMIN 4.6 03/29/2011 2104   AST 20 07/26/2018 0833   AST 26 03/29/2011 2104   ALT 24 07/26/2018 0833   ALT 40 03/29/2011 2104   ALKPHOS 50 07/26/2018 0833   ALKPHOS 53 03/29/2011 2104   BILITOT 0.6 07/26/2018 0833   BILITOT 0.7 03/29/2011 2104   GFRNONAA >60 03/29/2011 2104   GFRAA >60 03/29/2011 2104     Assessment/Plan:       Boone Master,  PA-C [facility] 02/22/2023, 8:34 AM  Cc: John Giovanni, MD

## 2023-03-01 NOTE — Progress Notes (Signed)
 Chief Complaint:nausea, weight loss, gas,bloat, altered bowels Primary GI Doctor: Dr. Albertus  HPI: Patient is a 63 year old male patient with past medical history of GERD,asthma, hyperlipidemia,hypertension,sleep apnea, prediabetes, who presents for nausea, weight loss, gas,bloat, altered bowels.  Interval History      Patient presents with several gastrointestinal complaints. Patient's first complaint is intermittent nausea over the course of the last few years. He states over the past 1.5 years it has increased in frequency and severity.Sometimes he vomits. He is currently OTC ginger chews which helps. He also has prescription antinausea medication he has not used.  Patient reports nausea is worse with eating large meals or eating late at night. Patient reports poor appetite due to symptoms. Patient has lost 40 lbs in the last year. He got up to 290 lbs after back surgery. He reports the first 15 lbs was intentional, the other 25 lbs has not be intentional. He does take Ibuprofen 400 mg TID po daily. Patient reports occasional heartburn if he eats late at night or eats certain food such as high protein.Patient denies dysphagia.     Patient also reports altered bowel habits with gas and bloat. His stools are typically semi formed, occasional diarrhea and constipation. He typically has one bowel movement in the morning. Sometimes if he has lunch he will have another bowel movement. He complains of excessive stomach gurgling. No blood in stool. He states he will have abdominal discomfort described as deep gas pain in the lower abdomen, relieved with bowel movement. No new medications. No recent travel or exposure. He has tried cutting out certain food groups such as dairy and gluten without improvement. Occasional urgency. No alcohol use. Nonsmoker. Patients last colonoscopy was 5/17/ 2022 with [facility], removed one polyp. Patient has never had EGD. Reports he does have stress, but not more that usual.   Patient's family history includes father had diverticulitis.   Wt Readings from Last 3 Encounters:  03/04/23 254 lb (115.2 kg)  01/24/20 276 lb 8 oz (125.4 kg)  09/12/19 275 lb (124.7 kg)    Past Medical History:  Diagnosis Date   Anxiety    with panic   Asthma    Colon polyps    Gastroenteritis 06/25/2000   GERD (gastroesophageal reflux disease)    Hyperlipidemia    Hypertension    Lumbar disc disease    Pneumonia 01/25/1993   Prediabetes    Sleep apnea    USES CPAP   Spondylolisthesis    Past Surgical History:  Procedure Laterality Date   COLONOSCOPY     KNEE ARTHROSCOPY  02/25/2010   L knee   LAMINECTOMY  01/26/1995   LUMBAR SPINE SURGERY  01/25/2005   x 2, fusion   MENISCUS REPAIR  02/25/2010   [facility]   SPINAL FUSION     Current Outpatient Medications  Medication Sig Dispense Refill   albuterol  (PROVENTIL ) (2.5 MG/3ML) 0.083% nebulizer solution USE 1 VIAL IN NEBULIZER AS DIRECTED 75 mL 3   albuterol  (VENTOLIN  HFA) 108 (90 Base) MCG/ACT inhaler TAKE 2 PUFFS BY MOUTH EVERY 6 HOURS AS NEEDED FOR WHEEZE OR SHORTNESS OF BREATH 18 each 0   amLODipine (NORVASC) 2.5 MG tablet Take 1 tablet by mouth daily.     amoxicillin  (AMOXIL ) 500 MG capsule Take 500 mg by mouth 3 (three) times daily.     atorvastatin (LIPITOR) 20 MG tablet Take 1 tablet by mouth daily.     celecoxib  (CELEBREX ) 100 MG capsule Take 100 mg by  mouth daily.     hydrOXYzine (ATARAX) 25 MG tablet Take 25 mg by mouth 2 (two) times daily.     ibuprofen (ADVIL,MOTRIN) 200 MG tablet Take 200 mg by mouth as needed. For pain     Multiple Vitamin (MULTIVITAMIN) capsule Take 1 capsule by mouth daily.     traMADol  (ULTRAM ) 50 MG tablet TAKE 1 TABLET BY MOUTH 4 TIMES DAILY. 120 tablet 0   valsartan (DIOVAN) 320 MG tablet Take 1 tablet by mouth daily.     No current facility-administered medications for this visit.    Allergies as of 03/04/2023 - Review Complete 03/04/2023  Allergen Reaction Noted    Shellfish allergy Anaphylaxis 03/04/2023   Wasp venom Other (See Comments), Swelling, and Hives 03/03/2021   Ciprofloxacin      Family History  Problem Relation Age of Onset   Depression Father        nervous breakdown   Hypertension Father    Ulcers Father    Cancer Father        dying of myelodysplasia   Diverticulitis Father    Other Sister        nervous breakdown   Asthma Sister    Liver cancer Maternal Grandfather    Ovarian cancer Paternal Grandmother    Bipolar disorder Son    Schizophrenia Son    Allergies Other        In family   Diverticulitis Paternal Uncle    Coronary artery disease Neg Hx    Diabetes Neg Hx    Migraines Neg Hx    Colon cancer Neg Hx    Esophageal cancer Neg Hx    Rectal cancer Neg Hx    Stomach cancer Neg Hx     Review of Systems:    Constitutional: No weight loss, fever, chills, weakness or fatigue HEENT: Eyes: No change in vision               Ears, Nose, Throat:  No change in hearing or congestion Skin: No rash or itching Cardiovascular: No chest pain, chest pressure or palpitations   Respiratory: No SOB or cough Gastrointestinal: See HPI and otherwise negative Genitourinary: No dysuria or change in urinary frequency Neurological: No headache, dizziness or syncope Musculoskeletal: No new muscle or joint pain Hematologic: No bleeding or bruising Psychiatric: No history of depression or anxiety    Physical Exam:  Vital signs: BP 106/64   Ht 6' 2.5 (1.892 m)   Wt 254 lb (115.2 kg)   BMI 32.18 kg/m   Constitutional:  Pleasant Caucasian male appears to be in NAD, Well developed, Well nourished, alert and cooperative Throat: Oral cavity and pharynx without inflammation, swelling or lesion.  Respiratory: Respirations even and unlabored. Lungs clear to auscultation bilaterally.   No wheezes, crackles, or rhonchi.  Cardiovascular: Normal S1, S2. Regular rate and rhythm. No peripheral edema, cyanosis or pallor.  Gastrointestinal:   Soft, nondistended, lower abdominal tenderness with palpation. Obese No rebound or guarding. Normal bowel sounds. No appreciable masses or hepatomegaly. Rectal:  Not performed.  Skin:   Dry and intact without significant lesions or rashes. Psychiatric: Oriented to person, place and time. Demonstrates good judgement and reason without abnormal affect or behaviors.  RELEVANT LABS AND IMAGING: CBC    Latest Ref Rng & Units 07/26/2018    8:33 AM 06/30/2017    3:06 PM 03/26/2016   12:43 PM  CBC  WBC 4.0 - 10.5 Valenzuela/uL 5.1  6.5  6.9   Hemoglobin 13.0 -  17.0 g/dL 85.8  86.0  85.6   Hematocrit 39.0 - 52.0 % 41.4  40.2  41.7   Platelets 150.0 - 400.0 Valenzuela/uL 225.0  217.0  230.0      CMP     Latest Ref Rng & Units 08/23/2019   10:41 AM 07/26/2018    8:33 AM 06/30/2017    3:06 PM  CMP  Glucose 70 - 99 mg/dL 82  99  76   BUN 6 - 23 mg/dL 14  13  10    Creatinine 0.40 - 1.50 mg/dL 9.08  9.02  8.98   Sodium 135 - 145 mEq/L 139  139  139   Potassium 3.5 - 5.1 mEq/L 3.8  4.2  3.9   Chloride 96 - 112 mEq/L 104  103  104   CO2 19 - 32 mEq/L 30  28  27    Calcium 8.4 - 10.5 mg/dL 9.2  9.2  9.2   Total Protein 6.0 - 8.3 g/dL  6.9  7.1   Total Bilirubin 0.2 - 1.2 mg/dL  0.6  0.3   Alkaline Phos 39 - 117 U/L  50  43   AST 0 - 37 U/L  20  19   ALT 0 - 53 U/L  24  21     06/10/20 one polyp, diverticulosis, internal hemorrhoids 11/08/2012 colonoscopy with Dr. Albertus, recall 10/2017 Endoscopic impression: -Normal mucosa in the terminal ileum -3 since sessile polyps ranging between 3 to 5 mm in size were found in the transverse colon, descending colon, and distal sigmoid colon; polypectomy was performed using cold snare Path: Diagnosis Surgical [P], transverse, descending, distal sigmoid colon polyp, bx - TUBULAR ADENOMA, 2 FRAGMENTS. - HYPERPLASTIC POLYP, 1 FRAGMENT. - NO HIGH GRADE DYSPLASIA OR MALIGNANCY IDENTIFIED  Assessment: Encounter Diagnoses  Name Primary?   Flatulence Yes   Bloating    Abdominal  discomfort    Altered bowel habits    Nausea and vomiting, unspecified vomiting type    [place]mi       63 year old male patient who presents with several gastrointestinal complaints that have been intermittent for the past year and more. His first complaint is of nausea and unintentional weight loss. Recommended upper GI endoscopy to rule out gastritis, PUD, or esophagitis. Also recommended he stop NSAID's which can increase risk of NSAID induced ulcers. He can use antiemetics as needed. If workup negative would consider motility test to rule out delayed gastric emptying.      Patient also has complaints of altered bowel habits with gas and bloat. He is UTD on colonoscopy. We discussed low FODmap diet and IBgard before meals. Recommend Sibo breath test and lab work to r/o celiac, inflammatory disease, or thyroid disease. Patient states he needs to check with his insurance to see what is covered under our office. Recommended he complete all testing under one GI doctor for continuity of care. Pt will contact office.   Plan: -check CRP, TTG IgA, IgA, TSH  - Cotninue antiemetics prn  -Recommend EGD in [facility] with Dr. Albertus.  -Recommend Sibo test - Handout Low fodmap diet provided -Hand out samples of Ibgard 2 capsules with meals  -request colonoscopy report form [facility] 05/2020  Thank you for the courtesy of this consult. Please call me with any questions or concerns.   Lee Aldrete, FNP-C [facility] 03/04/2023, 9:43 AM  Cc: Lee Valenzuela, Lee K, MD

## 2023-03-04 ENCOUNTER — Encounter: Payer: Self-pay | Admitting: Gastroenterology

## 2023-03-04 ENCOUNTER — Ambulatory Visit: Payer: No Typology Code available for payment source | Admitting: Gastroenterology

## 2023-03-04 VITALS — BP 106/64 | Ht 74.5 in | Wt 254.0 lb

## 2023-03-04 DIAGNOSIS — R140 Abdominal distension (gaseous): Secondary | ICD-10-CM | POA: Diagnosis not present

## 2023-03-04 DIAGNOSIS — R194 Change in bowel habit: Secondary | ICD-10-CM | POA: Diagnosis not present

## 2023-03-04 DIAGNOSIS — R198 Other specified symptoms and signs involving the digestive system and abdomen: Secondary | ICD-10-CM

## 2023-03-04 DIAGNOSIS — R112 Nausea with vomiting, unspecified: Secondary | ICD-10-CM

## 2023-03-04 DIAGNOSIS — R143 Flatulence: Secondary | ICD-10-CM | POA: Diagnosis not present

## 2023-03-04 DIAGNOSIS — R109 Unspecified abdominal pain: Secondary | ICD-10-CM

## 2023-03-04 NOTE — Patient Instructions (Addendum)
 We recommend following tests -Labs  CRP, TTG IgA, IgA, TSH  -Upper endoscopy  with Dr. Albertus.  -Anti nausea medication as needed -Small intestinal bacterial overgrowth breath test - Low fodmap diet -OTC Ibgard 2 capsules with meals  -Avoid NSAIDs (ibuprofen)  Please contact our office when you know which tests are covered by your insurance.  _______________________________________________________  If your blood pressure at your visit was 140/90 or greater, please contact your primary care physician to follow up on this.  _______________________________________________________  If you are age 61 or older, your body mass index should be between 23-30. Your Body mass index is 32.18 kg/m. If this is out of the aforementioned range listed, please consider follow up with your Primary Care Provider.  If you are age 6 or younger, your body mass index should be between 19-25. Your Body mass index is 32.18 kg/m. If this is out of the aformentioned range listed, please consider follow up with your Primary Care Provider.   ________________________________________________________  The [facility] providers would like to encourage you to use MYCHART to communicate with providers for non-urgent requests or questions.  Due to long hold times on the telephone, sending your provider a message by [facility] may be a faster and more efficient way to get a response.  Please allow 48 business hours for a response.  Please remember that this is for non-urgent requests.  _______________________________________________________  Thank you for trusting me with your gastrointestinal care!   Deanna May, NP

## 2023-03-07 NOTE — Progress Notes (Signed)
 Addendum: Reviewed and agree with assessment and management plan. Asha Grumbine, Carie Caddy, MD
